# Patient Record
Sex: Male | Born: 1970
Health system: Southern US, Community
[De-identification: ages and names within clinical notes are randomized; demographics above are authoritative.]

## PROBLEM LIST (undated history)

## (undated) DIAGNOSIS — E039 Hypothyroidism, unspecified: Secondary | ICD-10-CM

## (undated) DIAGNOSIS — E785 Hyperlipidemia, unspecified: Secondary | ICD-10-CM

## (undated) DIAGNOSIS — K219 Gastro-esophageal reflux disease without esophagitis: Secondary | ICD-10-CM

## (undated) DIAGNOSIS — T7840XA Allergy, unspecified, initial encounter: Secondary | ICD-10-CM

## (undated) HISTORY — PX: HAND SURGERY: SHX662

## (undated) HISTORY — DX: Gastro-esophageal reflux disease without esophagitis: K21.9

## (undated) HISTORY — DX: Hypothyroidism, unspecified: E03.9

## (undated) HISTORY — DX: Allergy, unspecified, initial encounter: T78.40XA

## (undated) HISTORY — DX: Hyperlipidemia, unspecified: E78.5

---

## 2004-10-11 ENCOUNTER — Ambulatory Visit: Payer: Self-pay | Admitting: Family Medicine

## 2004-10-18 ENCOUNTER — Ambulatory Visit: Payer: Self-pay | Admitting: Family Medicine

## 2005-01-17 ENCOUNTER — Ambulatory Visit: Payer: Self-pay | Admitting: Family Medicine

## 2005-04-18 ENCOUNTER — Ambulatory Visit: Payer: Self-pay | Admitting: Family Medicine

## 2005-05-28 ENCOUNTER — Ambulatory Visit: Payer: Self-pay | Admitting: Family Medicine

## 2005-07-25 ENCOUNTER — Ambulatory Visit: Payer: Self-pay | Admitting: Family Medicine

## 2005-10-31 ENCOUNTER — Ambulatory Visit: Payer: Self-pay | Admitting: Family Medicine

## 2006-07-24 ENCOUNTER — Ambulatory Visit: Payer: Self-pay | Admitting: Family Medicine

## 2006-07-31 ENCOUNTER — Ambulatory Visit: Payer: Self-pay | Admitting: Family Medicine

## 2006-09-04 ENCOUNTER — Ambulatory Visit: Payer: Self-pay | Admitting: Family Medicine

## 2006-11-16 ENCOUNTER — Emergency Department (HOSPITAL_COMMUNITY): Admission: EM | Admit: 2006-11-16 | Discharge: 2006-11-16 | Payer: Self-pay | Admitting: Family Medicine

## 2007-08-04 DIAGNOSIS — J309 Allergic rhinitis, unspecified: Secondary | ICD-10-CM | POA: Insufficient documentation

## 2007-08-06 ENCOUNTER — Ambulatory Visit: Payer: Self-pay | Admitting: Family Medicine

## 2007-08-07 LAB — CONVERTED CEMR LAB
Chloride: 107 meq/L (ref 96–112)
Cholesterol: 204 mg/dL (ref 0–200)
Direct LDL: 157.2 mg/dL
GFR calc non Af Amer: 80 mL/min
Glucose, Bld: 95 mg/dL (ref 70–99)
Sodium: 140 meq/L (ref 135–145)
Total CHOL/HDL Ratio: 5
Triglycerides: 53 mg/dL (ref 0–149)

## 2007-08-09 ENCOUNTER — Ambulatory Visit: Payer: Self-pay | Admitting: Family Medicine

## 2007-09-27 ENCOUNTER — Ambulatory Visit: Payer: Self-pay | Admitting: Family Medicine

## 2008-03-03 ENCOUNTER — Ambulatory Visit: Payer: Self-pay | Admitting: Family Medicine

## 2008-03-16 ENCOUNTER — Ambulatory Visit: Payer: Self-pay | Admitting: Family Medicine

## 2008-03-22 ENCOUNTER — Telehealth (INDEPENDENT_AMBULATORY_CARE_PROVIDER_SITE_OTHER): Payer: Self-pay | Admitting: *Deleted

## 2008-03-24 ENCOUNTER — Ambulatory Visit: Payer: Self-pay | Admitting: Family Medicine

## 2008-08-18 ENCOUNTER — Ambulatory Visit: Payer: Self-pay | Admitting: Family Medicine

## 2008-08-18 LAB — CONVERTED CEMR LAB
ALT: 32 units/L (ref 0–53)
Albumin: 4.2 g/dL (ref 3.5–5.2)
BUN: 16 mg/dL (ref 6–23)
CO2: 30 meq/L (ref 19–32)
Calcium: 9.2 mg/dL (ref 8.4–10.5)
Cholesterol: 236 mg/dL (ref 0–200)
Creatinine, Ser: 1 mg/dL (ref 0.4–1.5)
Creatinine,U: 225.2 mg/dL
Glucose, Bld: 88 mg/dL (ref 70–99)
Microalb, Ur: 0.5 mg/dL (ref 0.0–1.9)
Total Bilirubin: 1.2 mg/dL (ref 0.3–1.2)
Total CHOL/HDL Ratio: 6.1
Total Protein: 7.1 g/dL (ref 6.0–8.3)
Triglycerides: 74 mg/dL (ref 0–149)

## 2008-08-21 ENCOUNTER — Ambulatory Visit: Payer: Self-pay | Admitting: Family Medicine

## 2008-08-21 DIAGNOSIS — E039 Hypothyroidism, unspecified: Secondary | ICD-10-CM | POA: Insufficient documentation

## 2009-08-24 ENCOUNTER — Ambulatory Visit: Payer: Self-pay | Admitting: Family Medicine

## 2009-08-26 LAB — CONVERTED CEMR LAB
ALT: 56 units/L — ABNORMAL HIGH (ref 0–53)
Albumin: 4.3 g/dL (ref 3.5–5.2)
BUN: 12 mg/dL (ref 6–23)
Basophils Relative: 0.2 % (ref 0.0–3.0)
CO2: 28 meq/L (ref 19–32)
Calcium: 9.1 mg/dL (ref 8.4–10.5)
Creatinine, Ser: 1.2 mg/dL (ref 0.4–1.5)
Direct LDL: 185.3 mg/dL
Eosinophils Absolute: 0.2 10*3/uL (ref 0.0–0.7)
Glucose, Bld: 92 mg/dL (ref 70–99)
HCT: 44.3 % (ref 39.0–52.0)
HDL: 38.9 mg/dL — ABNORMAL LOW (ref >39.00)
Lymphs Abs: 2 10*3/uL (ref 0.7–4.0)
MCHC: 34.6 g/dL (ref 30.0–36.0)
MCV: 90.1 fL (ref 78.0–100.0)
Microalb, Ur: 0.7 mg/dL (ref 0.0–1.9)
Monocytes Absolute: 0.5 10*3/uL (ref 0.1–1.0)
Neutrophils Relative %: 51.6 % (ref 43.0–77.0)
Platelets: 213 10*3/uL (ref 150.0–400.0)
Total Protein: 7.2 g/dL (ref 6.0–8.3)
Triglycerides: 132 mg/dL (ref 0.0–149.0)

## 2009-09-03 ENCOUNTER — Ambulatory Visit: Payer: Self-pay | Admitting: Family Medicine

## 2010-02-22 ENCOUNTER — Ambulatory Visit: Payer: Self-pay | Admitting: Family Medicine

## 2010-02-22 DIAGNOSIS — J029 Acute pharyngitis, unspecified: Secondary | ICD-10-CM | POA: Insufficient documentation

## 2010-02-26 ENCOUNTER — Telehealth: Payer: Self-pay | Admitting: Family Medicine

## 2010-02-28 ENCOUNTER — Encounter: Payer: Self-pay | Admitting: Family Medicine

## 2010-03-20 ENCOUNTER — Ambulatory Visit: Payer: Self-pay | Admitting: Family Medicine

## 2010-04-23 ENCOUNTER — Encounter (INDEPENDENT_AMBULATORY_CARE_PROVIDER_SITE_OTHER): Payer: Self-pay | Admitting: *Deleted

## 2010-05-31 ENCOUNTER — Ambulatory Visit: Payer: Self-pay | Admitting: Family Medicine

## 2010-05-31 DIAGNOSIS — M542 Cervicalgia: Secondary | ICD-10-CM | POA: Insufficient documentation

## 2010-05-31 LAB — CONVERTED CEMR LAB
HDL goal, serum: 40 mg/dL
LDL Goal: 130 mg/dL

## 2010-08-28 ENCOUNTER — Telehealth (INDEPENDENT_AMBULATORY_CARE_PROVIDER_SITE_OTHER): Payer: Self-pay | Admitting: *Deleted

## 2010-08-30 ENCOUNTER — Ambulatory Visit: Payer: Self-pay | Admitting: Family Medicine

## 2010-08-30 LAB — CONVERTED CEMR LAB
Albumin: 4.1 g/dL (ref 3.5–5.2)
Alkaline Phosphatase: 68 units/L (ref 39–117)
CO2: 27 meq/L (ref 19–32)
Chloride: 106 meq/L (ref 96–112)
Cholesterol: 239 mg/dL — ABNORMAL HIGH (ref 0–200)
Creatinine, Ser: 0.9 mg/dL (ref 0.4–1.5)
Direct LDL: 178.6 mg/dL
HDL: 40.8 mg/dL (ref >39.00)
Potassium: 4.6 meq/L (ref 3.5–5.1)
Sodium: 141 meq/L (ref 135–145)
TSH: 7.3 microintl units/mL — ABNORMAL HIGH (ref 0.35–5.50)
Total CHOL/HDL Ratio: 6
Total Protein: 6.9 g/dL (ref 6.0–8.3)
Triglycerides: 75 mg/dL (ref 0.0–149.0)
VLDL: 15 mg/dL (ref 0.0–40.0)

## 2010-09-05 ENCOUNTER — Ambulatory Visit: Payer: Self-pay | Admitting: Family Medicine

## 2010-10-15 NOTE — Progress Notes (Signed)
Summary: wants referral to ent  Phone Note Call from Patient Call back at Work Phone [phone]   Caller: Patient Call For: Dr. Dayton Martes Summary of Call: Patient called complaining of his throat. He said it feels like it is really tight and now his ears are also stopped up. He says that is is especially bad after being out in the heat and then going in to the air conditioner. It is really bothering him and he is asking if can go ahead and get referral to ENT. Please advise.  Initial call taken by: Melody Comas,  February 26, 2010 9:09 AM  Follow-up for Phone Call        Appt with Royden Purl on 02/28/2010 at 8:20am. Carlton Adam  February 27, 2010 10:42 AM  Follow-up by: Carlton Adam,  February 27, 2010 10:42 AM
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
## 2010-10-15 NOTE — Consult Note (Signed)
Summary: [facility]  [facility]   Imported By: Lester Gilman 03/04/2010 12:30:13  _____________________________________________________________________  External Attachment:    Type:   Image     Comment:   External Document

## 2010-10-15 NOTE — Assessment & Plan Note (Signed)
Summary: FLU LIKE SYMPTOMS/DLO   Vital Signs:  Patient profile:   40 year old male Height:      69 inches Weight:      191 pounds BMI:     28.31 Temp:     98.6 degrees F oral Pulse rate:   76 / minute Pulse rhythm:   regular BP sitting:   104 / 70  (right arm) Cuff size:   regular  Vitals Entered By: Linde Gillis CMA Duncan Dull) (March 20, 2010 3:30 PM)   Acute Visit History:      The patient complains of cough, earache, headache, nasal discharge, sinus problems, and sore throat.  These symptoms began 4 days ago.  He denies abdominal pain, fever, and vomiting.        The cough interferes with his sleep.  The character of the cough is described as productive, yellow.  There is no history of wheezing or shortness of breath associated with his cough.        Earache symptom: bilateral fullness, no pain.  There is no history of recent antibiotic usage associated with the earache.        He complains of sinus pressure.        Problems Prior to Update: 1)  Sore Throat  (ICD-462) 2)  Hypothyroidism  (ICD-244.9) 3)  Health Maintenance Exam  (ICD-V70.0) 4)  Hypercholesterolemia, 263/ Ldl 211  (ICD-272.0) 5)  Hyperglycemia (110- 02/08)  (ICD-790.29) 6)  Nicotine Addiction/chews  (ICD-305.1) 7)  Allergic Rhinitis  (ICD-477.9)  Current Medications (verified): 1)  Synthroid 75 Mcg Tabs (Levothyroxine Sodium) .... One Tab By Mouth Once Daily 2)  Azithromycin 250 Mg Tabs (Azithromycin) .... 2 Tas By Mouth X 1 Day, Then 1 Tab By Mouth Daily X 4 Days Fill If Not Improving in 3-4 Days, Void After 04/15/2010  Allergies (verified): No Known Drug Allergies  Past History:  Past medical, surgical, family and social histories (including risk factors) reviewed, and no changes noted (except as noted below).  Past Medical History: Reviewed history from 08/04/2007 and no changes required. Allergic rhinitis GERD  Family History: Reviewed history from 08/21/2008 and no changes required. Adopted and has  no known relative histories Father: Alive Mother:  His real mother died in her 72's of alcohol disease Siblings:Adopted  3 brothers, older had HTN, 3 older sisters 1 sister on thyroid meds CV - none HBP + oldest brother  Social History: Reviewed history from 09/27/2007 and no changes required. Marital Status: Married Children: None Occupation:Is in heating and air  dips tobacco NON Smoker  Review of Systems General:  Complains of fatigue; denies fever. CV:  Complains of chest pain or discomfort; CP with cough. GI:  Denies abdominal pain, diarrhea, nausea, and vomiting.  Physical Exam  General:  Well-developed,well-nourished,in no acute distress; alert,appropriate and cooperative throughout examination Head:  no maxillary sinus ttp Eyes:  No corneal or conjunctival inflammation noted. EOMI. Perrla. Funduscopic exam benign, without hemorrhages, exudates or papilledema. Vision grossly normal. Ears:  clear fluid B TMs.  Nose:  External nasal examination shows no deformity or inflammation. Nasal mucosa are pink and moist without lesions or exudates. Mouth:  Oral mucosa and oropharynx without lesions or exudates.  Teeth in good repair. Neck:  no carotid bruit or thyromegaly no cervical or supraclavicular lymphadenopathy  Lungs:  Normal respiratory effort, chest expands symmetrically. Lungs are clear to auscultation, no crackles or wheezes. Heart:  Normal rate and regular rhythm. S1 and S2 normal without gallop, murmur, click,  rub or other extra sounds.   Impression & Recommendations:  Problem # 1:  URI (ICD-465.9) Symptomatic care...mucinex two times a day. If not improving as expected treat with antibiotic given pt concern of progression.   Complete Medication List: 1)  Synthroid 75 Mcg Tabs (Levothyroxine sodium) .... One tab by mouth once daily 2)  Azithromycin 250 Mg Tabs (Azithromycin) .... 2 tas by mouth x 1 day, then 1 tab by mouth daily x 4 days fill if not improving in 3-4  days, void after 04/15/2010  Patient Instructions: 1)  Use mucinex (guafenesin) two times a day. 2)   If not improving in next 2-3 days...fill antibiotics. 3)  Call if not improving after 7-10 days. Prescriptions: AZITHROMYCIN 250 MG TABS (AZITHROMYCIN) 2 tas by mouth x 1 day, then 1 tab by mouth daily x 4 days FIll if not improving in 3-4 days, VOID after 04/15/2010  #6 x 0   Entered and Authorized by:   Kerby Nora MD   Signed by:   Kerby Nora MD on 03/20/2010   Method used:   Print then Give to Patient   RxID:   [id number]   Current Allergies (reviewed today): No known allergies

## 2010-10-15 NOTE — Letter (Signed)
Summary: Nadara Eaton letter  [place] [facility]  [address] [place], [place] [address]   Phone: [phone]  Fax: [phone]       04/23/2010 MRN: [id number]  Sinan Sampey [address] [place], [place]  [address]  Dear Mr. Mariel [facility] [place], and [facility] announce the retirement of Arta Silence, M.D., from full-time practice at the [facility] office effective March 14, 2010 and his plans of returning part-time.  It is important to Dr. Hetty Ely and to our practice that you understand that [facility] has seven physicians in our office for your health care needs.  We will continue to offer the same exceptional care that you have today.    Dr. Hetty Ely has spoken to many of you about his plans for retirement and returning part-time in the fall.   We will continue to work with you through the transition to schedule appointments for you in the office and meet the high standards that [facility] is committed to.   Again, it is with great pleasure that we share the news that Dr. Hetty Ely will return to [facility] at [facility] in October of 2011 with a reduced schedule.    If you have any questions, or would like to request an appointment with one of our physicians, please call us at [phone] and press the option for Scheduling an appointment.  We take pleasure in providing you with excellent patient care and look forward to seeing you at your next office visit.  Our [facility] Physicians are:  Tillman Abide, M.D. Laurita Quint, M.D. Roxy Manns, M.D. Kerby Nora, M.D. Hannah Beat, M.D. Ruthe Mannan, M.D. We proudly welcomed Raechel Ache, M.D. and Eustaquio Boyden, M.D. to the practice in July/August 2011.  Sincerely,  [facility]

## 2010-10-15 NOTE — Assessment & Plan Note (Signed)
Summary: SORE THROAT/RBH   Vital Signs:  Patient profile:   40 year old male Height:      69 inches Weight:      192.25 pounds BMI:     28.49 Temp:     98.6 degrees F oral Pulse rate:   68 / minute Pulse rhythm:   regular BP sitting:   140 / 90  (left arm) Cuff size:   regular  Vitals Entered By: Linde Gillis CMA Duncan Dull) (February 22, 2010 10:20 AM) CC: sore throat   History of Present Illness: 40 yo with 1 week of sore throat.  Actually painful externally, not inside of throat. Tender to touch on outside. No difficulty or pain swallowing. No runny nose, cough, shortness of breath, fever or other symptoms.  Has been working in dry wall and with paint fumes. Sometimes forgets to wear his mask.  Does not smoke cigarettes but he does chew tobacco.  Finished with this new job that exposes him to alot of dusk and fumes.  Current Medications (verified): 1)  Synthroid 75 Mcg Tabs (Levothyroxine Sodium) .... One Tab By Mouth Once Daily  Allergies (verified): No Known Drug Allergies  Review of Systems      See HPI General:  Denies chills and fever. ENT:  Denies difficulty swallowing and earache. Resp:  Denies cough, shortness of breath, sputum productive, and wheezing. MS:  Denies joint pain, joint redness, joint swelling, and loss of strength.  Physical Exam  General:  Well-developed,well-nourished,in no acute distress; alert,appropriate and cooperative throughout examination Ears:  External ear exam shows no significant lesions or deformities.  Otoscopic examination reveals clear canals, tympanic membranes are intact bilaterally without bulging, retraction, inflammation or discharge. Hearing is grossly normal bilaterally. Nose:  External nasal examination shows no deformity or inflammation. Nasal mucosa are pink and moist without lesions or exudates. Mouth:  Oral mucosa and oropharynx without lesions or exudates.  Teeth in good repair. Neck:  supple, full ROM, and no masses.    Non tender to palp Lungs:  Normal respiratory effort, chest expands symmetrically. Lungs are clear to auscultation, no crackles or wheezes. Heart:  Normal rate and regular rhythm. S1 and S2 normal without gallop, murmur, click, rub or other extra sounds. Skin:  Intact without suspicious lesions or rashes Cervical Nodes:  No lymphadenopathy noted Psych:  Cognition and judgment appear intact. Alert and cooperative with normal attention span and concentration. No apparent delusions, illusions, hallucinations   Impression & Recommendations:  Problem # 1:  SORE THROAT (ICD-462) Assessment New Reassurance provided.  Perhaps had some reactive lymph nodes from exposure to chemicals and dust.  Since he does chew tobacco, recommended close follow up.  If symptoms do not continue to improve, likely needs to be evaluated by ENT.  Complete Medication List: 1)  Synthroid 75 Mcg Tabs (Levothyroxine sodium) .... One tab by mouth once daily  Patient Instructions: 1)  If no improvement, please call us if no improvement in 1-2 weeks. 2)  STOP DIPPING.  Current Allergies (reviewed today): No known allergies

## 2010-10-17 NOTE — Assessment & Plan Note (Signed)
Summary: cpx/rbh dr schaller pt  R/S FROM 12/23   Vital Signs:  Patient profile:   40 year old male Height:      69 inches Weight:      199.25 pounds BMI:     29.53 Temp:     98.2 degrees F oral Pulse rate:   80 / minute Pulse rhythm:   regular BP sitting:   110 / 68  (left arm) Cuff size:   large  Vitals Entered By: Delilah Shan CMA Duncan Dull) (September 05, 2010 8:59 AM) CC: CPX - RNS patient   History of Present Illness: CPE- See plan.  Neck discomfort is better.  See last OV note.  It sometimes happens when he tenses up at work and he notices the pain, but then it self resolves.  If he gets nervous, it happens.  Anterior near the hyoid bone.  No dysphagia.  No FCNAVDR, no other complaints.    Allergies: No Known Drug Allergies  Past History:  Family History: Last updated: 09/05/2010 Adopted (his biological mother gave him up for adoption and he was adopted by her sister) Father: unknown Mother:  His biological mother died in her 13's of alcohol disease Siblings:Adopted  3 brothers, older had HTN, 3 older sisters 1 sister on thyroid meds CV - none HBP + oldest brother  Social History: Last updated: 09/05/2010 Marital Status: Married, 2002 Children: None Occupation:Is in heating and air  dips tobacco, working on quitting 2012 Nonsmoker  Past Medical History: Allergic rhinitis GERD Hypothyroidism  Past Surgical History: Denies surgical history  Family History: Adopted (his biological mother gave him up for adoption and he was adopted by her sister) Father: unknown Mother:  His biological mother died in her 56's of alcohol disease Siblings:Adopted  3 brothers, older had HTN, 3 older sisters 1 sister on thyroid meds CV - none HBP + oldest brother  Social History: Marital Status: Married, 2002 Children: None Occupation:Is in heating and air  dips tobacco, working on quitting 2012 Nonsmoker  Review of Systems       See HPI.  Otherwise negative.     Physical Exam  General:  GEN: nad, alert and oriented HEENT: mucous membranes moist NECK: supple w/o LA, not tender to palpation, no nodularity or asymmetry of thyroid appreciated CV: rrr.  no murmur PULM: ctab, no inc wob ABD: soft, +bs EXT: no edema SKIN: no acute rash    Impression & Recommendations:  Problem # 1:  HEALTH MAINTENANCE EXAM (ICD-V70.0) colon/prostate ca screen not indicated.  flu shot encouarged.  D/w patient [id number] and diet.  recheck lipids in 1 year.  Td up to date.    Problem # 2:  HYPOTHYROIDISM (ICD-244.9) I think he is tensing up and having muscle strain on the anterior/strap muscles.  I don't appreciated other abnormality.  It is episodic and we can investigate further if symptoms worsen.  See instructions re:TSH and synthroid.   His updated medication list for this problem includes:    Synthroid 75 Mcg Tabs (Levothyroxine sodium) .Marland Kitchen... 1.5 tabs on thursday and 1 tab by mouth other days of the week  Orders: Prescription Created Electronically [id number])  Complete Medication List: 1)  Synthroid 75 Mcg Tabs (Levothyroxine sodium) .... 1.5 tabs on thursday and 1 tab by mouth other days of the week  Patient Instructions: 1)  Increase your thyroid medicine- 1.5 tabs on Thursday and 1 tab on other days.  Recheck TSH in 2 months.  244.9 2)  Work on exercising  more and cutting back on fatty foods.  I would recheck your cholesterol next year at a physical.   3)  Think about getting a flu shot.   4)  Take care.  Glad to see you.   Prescriptions: SYNTHROID 75 MCG TABS (LEVOTHYROXINE SODIUM) 1.5 tabs on Thursday and 1 tab by mouth other days of the week  #100 x 3   Entered and Authorized by:   Crawford Givens MD   Signed by:   Crawford Givens MD on 09/05/2010   Method used:   Electronically to        [facility] [address] (retail)       [address] [place], [place]  [address]       Ph: [phone]       Fax:  [phone]   RxID:   [id number]    Orders Added: 1)  Est. Patient 40-64 years [99396] 2)  Est. Patient Level III [40347] 3)  Prescription Created Electronically [phone]

## 2010-10-17 NOTE — Progress Notes (Signed)
----   Converted from flag ---- ---- 08/27/2010 1:46 PM, Crawford Givens MD wrote: tsh 244.9 cmet/lipid 272.0  ---- 08/27/2010 9:55 AM, Liane Comber CMA (AAMA) wrote: Lab orders please! Good Morning! This pt is scheduled for cpx labs Friday, which labs to draw and dx codes to use? Thanks Tasha ------------------------------

## 2010-11-08 ENCOUNTER — Encounter (INDEPENDENT_AMBULATORY_CARE_PROVIDER_SITE_OTHER): Payer: Self-pay | Admitting: *Deleted

## 2010-11-08 ENCOUNTER — Other Ambulatory Visit (INDEPENDENT_AMBULATORY_CARE_PROVIDER_SITE_OTHER): Payer: 59

## 2010-11-08 ENCOUNTER — Other Ambulatory Visit: Payer: Self-pay | Admitting: Family Medicine

## 2010-11-08 DIAGNOSIS — E039 Hypothyroidism, unspecified: Secondary | ICD-10-CM

## 2010-12-23 ENCOUNTER — Encounter: Payer: Self-pay | Admitting: Family Medicine

## 2010-12-25 ENCOUNTER — Encounter: Payer: Self-pay | Admitting: Family Medicine

## 2010-12-25 ENCOUNTER — Ambulatory Visit (INDEPENDENT_AMBULATORY_CARE_PROVIDER_SITE_OTHER): Payer: BC Managed Care – PPO | Admitting: Family Medicine

## 2010-12-25 VITALS — BP 126/96 | HR 76 | Temp 98.1°F | Wt 200.1 lb

## 2010-12-25 DIAGNOSIS — J029 Acute pharyngitis, unspecified: Secondary | ICD-10-CM

## 2010-12-25 DIAGNOSIS — J309 Allergic rhinitis, unspecified: Secondary | ICD-10-CM

## 2010-12-25 NOTE — Assessment & Plan Note (Addendum)
Start claritin/loratadine 10mg  a day and use nasal saline.  Can gargle with warm salt water for OP irritation. Fu prn.  He agrees. I doubt this is infectious.  He may have pulled strap muscle with sneezing.

## 2010-12-25 NOTE — Patient Instructions (Signed)
Start claritin/loratadine 10mg  a day and use nasal saline.  Can gargle with warm salt water for irritation in yourmouth.  Take care.  Let me know if you aren't getting better. Marland Kitchen

## 2010-12-25 NOTE — Progress Notes (Signed)
Sx going on for about 2 weeks.  ST is better now.  Sinus pain.  Some nosebleeds.  He was having some dry mouth from mouth breathing at night.  Sig pollen exposure with work outside. B ear pain.  Still with tongue irritation. Was prev on allegra years ago, with some relief.  Didn't tolerate flonase.  No fevers, but did have one night with sweats prev.   Occ pain under the chin, noted inc with sneezing and with lifting.  This had happened prev and was thought to be due to a strap muscle irritation- and it had prev resolved.    Meds, vitals, and allergies reviewed.   ROS: See HPI.  Otherwise, noncontributory.  GEN: nad, alert and oriented HEENT: mucous membranes moist, tm w/o erythema but B SOM noted, nasal exam with mild erythema, clear discharge noted,  OP with cobblestoning NECK: supple w/o LA CV: rrr.   PULM: ctab, no inc wob EXT: no edema SKIN: no acute rash

## 2011-02-03 ENCOUNTER — Encounter: Payer: Self-pay | Admitting: Family Medicine

## 2011-02-03 ENCOUNTER — Ambulatory Visit (INDEPENDENT_AMBULATORY_CARE_PROVIDER_SITE_OTHER): Payer: BC Managed Care – PPO | Admitting: Family Medicine

## 2011-02-03 DIAGNOSIS — K117 Disturbances of salivary secretion: Secondary | ICD-10-CM

## 2011-02-03 DIAGNOSIS — R682 Dry mouth, unspecified: Secondary | ICD-10-CM

## 2011-02-03 NOTE — Patient Instructions (Signed)
Use the nicotine gum to get off the dip, use mints to keep your mouth from getting dry, and call me if you aren't getting better in about 1-2 weeks.

## 2011-02-03 NOTE — Assessment & Plan Note (Addendum)
I doubt a process like sjogren's syndrome, no h/o dry eyes.  This may be due to allergic sx- ie allergic rhinitis and subsequent mouth breathing- with a component from the dip. I encouraged him to get off tobacco, use mints, and let me know if not improved.  I don't see acute changes that are worrisome.  If he continues to have sx, he'll call me.  He agrees.

## 2011-02-03 NOTE — Progress Notes (Signed)
Dry mouth and tongue symptoms.  It seemed to start after 1 night where his tongue dried out after sleeping with mouth open.  He occ has intermittent tongue discomfort.  If having pain, then he can drink milk and it will help.  Notes symptoms on roof of mouth also.  Dipping <1 can a day of snuff, trying to quit.  Sig pollen exposure with grass cutting, taking tylenol/pseudophed for this with some relief.  Allergy sx not better with loratadine.    Meds, vitals, and allergies reviewed.   ROS: See HPI.  Otherwise, noncontributory.  nad ncat Tm wnl Nasal exam wnl Op exam w/o ulceration or acute abnormality noted on the tongue, gingiva, buccal membranes or in the posterior op.  No thrush noted. MMM Neck supple w/o LA.

## 2011-02-09 ENCOUNTER — Inpatient Hospital Stay (INDEPENDENT_AMBULATORY_CARE_PROVIDER_SITE_OTHER)
Admission: RE | Admit: 2011-02-09 | Discharge: 2011-02-09 | Disposition: A | Discharge status: Home / Self Care | Payer: BC Managed Care – PPO | Source: Ambulatory Visit | Attending: Emergency Medicine | Admitting: Emergency Medicine

## 2011-02-09 DIAGNOSIS — J069 Acute upper respiratory infection, unspecified: Secondary | ICD-10-CM

## 2011-02-09 DIAGNOSIS — H9209 Otalgia, unspecified ear: Secondary | ICD-10-CM

## 2011-02-09 DIAGNOSIS — J029 Acute pharyngitis, unspecified: Secondary | ICD-10-CM

## 2011-02-28 ENCOUNTER — Telehealth: Payer: Self-pay | Admitting: *Deleted

## 2011-02-28 DIAGNOSIS — R682 Dry mouth, unspecified: Secondary | ICD-10-CM

## 2011-02-28 NOTE — Telephone Encounter (Signed)
Patient wanted to let you know that his tongue and roof of mouth is still numb. He is asking what to do at this point because he say that he has been dealing with this for a while now. Please advise.

## 2011-02-28 NOTE — Telephone Encounter (Signed)
I LMOVM for patient.  If he is still having trouble, then I'd have him see ENT.  I went ahead and put in the referral, but I'll ask Shirlee Limerick to hold on this for now.  Please call the patient and then notify [facility].  Thanks.

## 2011-03-03 NOTE — Telephone Encounter (Signed)
Appt made with Royden Purl on 03/26/2011 pt has been notified.

## 2011-03-03 NOTE — Telephone Encounter (Signed)
Advised pt, he agrees to ENT referral.  Has been to [facility] before, doesn't remember who he saw.

## 2011-07-24 ENCOUNTER — Other Ambulatory Visit: Payer: Self-pay | Admitting: *Deleted

## 2011-07-24 MED ORDER — LEVOTHYROXINE SODIUM 75 MCG PO TABS: ORAL_TABLET | ORAL | Status: DC

## 2011-07-24 NOTE — Telephone Encounter (Signed)
Received faxed refill request from pharmacy. Rx sent to pharmacy electronically. 

## 2011-08-20 ENCOUNTER — Other Ambulatory Visit: Payer: Self-pay | Admitting: Family Medicine

## 2012-01-15 ENCOUNTER — Other Ambulatory Visit: Payer: Self-pay | Admitting: *Deleted

## 2012-01-15 MED ORDER — LEVOTHYROXINE SODIUM 75 MCG PO TABS: ORAL_TABLET | ORAL | Status: DC

## 2012-02-13 ENCOUNTER — Other Ambulatory Visit: Payer: Self-pay | Admitting: Family Medicine

## 2012-02-13 NOTE — Telephone Encounter (Signed)
Electronic refill request.  Has not had appt in some time.  Please advise.

## 2012-02-14 NOTE — Telephone Encounter (Signed)
Sent, please schedule CPE with lab visit ahead of time.  Thanks.

## 2012-02-16 NOTE — Telephone Encounter (Signed)
Patient advised.  Says he will call back in for CPE.

## 2012-03-19 ENCOUNTER — Other Ambulatory Visit: Payer: Self-pay | Admitting: Family Medicine

## 2012-03-19 ENCOUNTER — Other Ambulatory Visit: Payer: Self-pay

## 2012-03-19 NOTE — Telephone Encounter (Signed)
Pt requesting refill on Synthroid; pt should have one more refill and will ck with H/T. Pt will call for appt.

## 2012-04-14 ENCOUNTER — Other Ambulatory Visit: Payer: Self-pay | Admitting: Family Medicine

## 2012-04-14 NOTE — Telephone Encounter (Signed)
Patient hasn't been seen in a year. Rx was last filled 02/13/12? Ok to refill?

## 2012-04-14 NOTE — Telephone Encounter (Signed)
Per chart, he was advised to schedule CPE with the last refill.  Deny this unless he is scheduled for CPE.  If scheduled, okay to fill through that date.

## 2012-04-15 NOTE — Telephone Encounter (Signed)
Patient says he is "swamped" at work right now and just found out he may have "jury duty" next week.  He is asking if he could get the labs drawn now and schedule a CPE in the Fall?

## 2012-04-15 NOTE — Telephone Encounter (Signed)
Sent.  Schedule CPE for the fall.  

## 2012-08-06 ENCOUNTER — Other Ambulatory Visit: Payer: Self-pay | Admitting: Family Medicine

## 2012-08-06 NOTE — Telephone Encounter (Signed)
Electronic refill request..  Patient has not been seen here since May 2012.  Please advise.

## 2012-08-06 NOTE — Telephone Encounter (Signed)
Wife advised. 

## 2012-08-06 NOTE — Telephone Encounter (Signed)
Sent, needs a CPE with labs ahead of time.  Thanks.

## 2012-09-01 ENCOUNTER — Other Ambulatory Visit: Payer: Self-pay | Admitting: Family Medicine

## 2012-11-01 ENCOUNTER — Ambulatory Visit
Admission: RE | Admit: 2012-11-01 | Discharge: 2012-11-01 | Disposition: A | Discharge status: Home / Self Care | Payer: 59 | Source: Ambulatory Visit | Attending: Family Medicine | Admitting: Family Medicine

## 2012-11-01 ENCOUNTER — Encounter: Payer: Self-pay | Admitting: Family Medicine

## 2012-11-01 ENCOUNTER — Other Ambulatory Visit: Payer: Self-pay | Admitting: Family Medicine

## 2012-11-01 ENCOUNTER — Ambulatory Visit (INDEPENDENT_AMBULATORY_CARE_PROVIDER_SITE_OTHER): Payer: 59 | Admitting: Family Medicine

## 2012-11-01 VITALS — BP 104/80 | HR 68 | Temp 98.4°F | Wt 201.0 lb

## 2012-11-01 DIAGNOSIS — N50819 Testicular pain, unspecified: Secondary | ICD-10-CM

## 2012-11-01 DIAGNOSIS — N509 Disorder of male genital organs, unspecified: Secondary | ICD-10-CM

## 2012-11-01 MED ORDER — LEVOTHYROXINE SODIUM 75 MCG PO TABS: ORAL_TABLET | ORAL | Status: DC

## 2012-11-01 NOTE — Patient Instructions (Addendum)
Schedule a physical for spring or summer.   See Shirlee Limerick about your referral before you leave today. We'll contact you with your report. If you have any other symptoms, then notify the clinic.

## 2012-11-01 NOTE — Progress Notes (Signed)
He had some pain in groin after long distance travel in 12/13.  The pain resolved. No other symptoms at that point.  No discharge.    Doing well with no trouble/sx with intercourse 2/14 but then saw a small amount of blood in shorts after intercourse.   "My mind has been on it and I don't know if that make the pain come back."   Pain/discomfort is in the R testicle.    Monogamous.  No h/o STDs.  No h/o testing.  Last urinated about 9:30.   His prev dry mouth is now intermittent and improved from prev.  He has seen ENT prev.   Meds, vitals, and allergies reviewed.   ROS: See HPI.  Otherwise, noncontributory.  nad Testes bilaterally descended without nodularity, tenderness or masses. No scrotal masses or lesions. No penis lesions or urethral discharge.

## 2012-11-02 NOTE — Assessment & Plan Note (Signed)
Unremarkable exam and u/s.  Would follow for now with f/u testing of urine if sx continue.  D/w pt about the plan (assuming neg u/s) before the pt went for u/s.  He agreed.

## 2012-11-06 ENCOUNTER — Other Ambulatory Visit: Payer: Self-pay | Admitting: Family Medicine

## 2012-11-24 ENCOUNTER — Ambulatory Visit (INDEPENDENT_AMBULATORY_CARE_PROVIDER_SITE_OTHER): Payer: 59 | Admitting: Family Medicine

## 2012-11-24 ENCOUNTER — Encounter: Payer: Self-pay | Admitting: Family Medicine

## 2012-11-24 ENCOUNTER — Telehealth: Payer: Self-pay

## 2012-11-24 VITALS — BP 110/84 | HR 68 | Temp 98.1°F | Wt 198.5 lb

## 2012-11-24 DIAGNOSIS — R361 Hematospermia: Secondary | ICD-10-CM

## 2012-11-24 DIAGNOSIS — N50819 Testicular pain, unspecified: Secondary | ICD-10-CM

## 2012-11-24 DIAGNOSIS — R1030 Lower abdominal pain, unspecified: Secondary | ICD-10-CM

## 2012-11-24 DIAGNOSIS — N509 Disorder of male genital organs, unspecified: Secondary | ICD-10-CM

## 2012-11-24 DIAGNOSIS — R109 Unspecified abdominal pain: Secondary | ICD-10-CM

## 2012-11-24 LAB — POCT URINALYSIS DIPSTICK
Blood, UA: NEGATIVE
Ketones, UA: NEGATIVE
Leukocytes, UA: NEGATIVE
Spec Grav, UA: 1.02
pH, UA: 5

## 2012-11-24 NOTE — Telephone Encounter (Signed)
Pt seen 11/01/12; this weekend lifting brush outside and began to notice rt groin and testicle pain again (pain level 2-3 when lifting). No lumps or knots noticed and no penial discharge; reddish semen again as discussed 02/17 visit. [employer].Please advise.
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
## 2012-11-24 NOTE — Telephone Encounter (Signed)
Called pt. Appt scheduled for today.

## 2012-11-24 NOTE — Patient Instructions (Addendum)
Go to the lab on the way out.  We'll contact you with your lab report. If all your labs are normal, then we'll likely set you up with urology.  Take care.

## 2012-11-24 NOTE — Telephone Encounter (Signed)
See about getting him on the schedule tomorrow with me.  We need to check some urine labs and I'd like to check him at the same time.  Make sure he has gone at least 1 hour without urinating before he comes in.  Thanks.

## 2012-11-24 NOTE — Assessment & Plan Note (Signed)
With likely/presumed hematospermia x2.  No symptoms now and normal exam today.  Prev with u/s wnl.  ucx and GC/chlam pending.  If both neg, then likely refer to uro. He agrees. Will hold off on abx now as likelihood of STI is very low and prostate isn't ttp.

## 2012-11-26 LAB — URINE CULTURE
Colony Count: NO GROWTH
Organism ID, Bacteria: NO GROWTH

## 2012-11-26 NOTE — Addendum Note (Signed)
Addended by: Joaquim Nam on: 11/26/2012 08:07 AM   Modules accepted: Orders

## 2013-04-09 ENCOUNTER — Other Ambulatory Visit: Payer: Self-pay | Admitting: Family Medicine

## 2013-04-11 NOTE — Telephone Encounter (Signed)
Electronic refill request. Please advise. Last refill stated that he needed an appt.  No appt scheduled.  TSH not checked since 2012.

## 2013-04-11 NOTE — Telephone Encounter (Signed)
If he'll schedule the CPE, then call send it in.  Thanks.

## 2013-04-12 ENCOUNTER — Other Ambulatory Visit: Payer: Self-pay | Admitting: *Deleted

## 2013-04-12 MED ORDER — SYNTHROID 75 MCG PO TABS: ORAL_TABLET | ORAL | Status: DC

## 2013-04-12 NOTE — Telephone Encounter (Signed)
Called patient.  He says he will try to schedule in September when he finishes this project at work.  He requested a 1 month supply to last until he can schedule a CPE.  Rx sent to pharmacy.

## 2013-05-09 ENCOUNTER — Other Ambulatory Visit: Payer: Self-pay | Admitting: Family Medicine

## 2013-05-09 DIAGNOSIS — E039 Hypothyroidism, unspecified: Secondary | ICD-10-CM

## 2013-05-09 DIAGNOSIS — E7800 Pure hypercholesterolemia, unspecified: Secondary | ICD-10-CM

## 2013-05-10 ENCOUNTER — Other Ambulatory Visit (INDEPENDENT_AMBULATORY_CARE_PROVIDER_SITE_OTHER): Payer: 59

## 2013-05-10 DIAGNOSIS — E039 Hypothyroidism, unspecified: Secondary | ICD-10-CM

## 2013-05-10 DIAGNOSIS — E7800 Pure hypercholesterolemia, unspecified: Secondary | ICD-10-CM

## 2013-05-10 LAB — TSH: TSH: 10.07 u[IU]/mL — ABNORMAL HIGH (ref 0.35–5.50)

## 2013-05-10 LAB — LIPID PANEL
Cholesterol: 265 mg/dL — ABNORMAL HIGH (ref 0–200)
HDL: 39.8 mg/dL (ref >39.00)
Total CHOL/HDL Ratio: 7
Triglycerides: 129 mg/dL (ref 0.0–149.0)

## 2013-05-10 LAB — BASIC METABOLIC PANEL
CO2: 27 mEq/L (ref 19–32)
Chloride: 106 mEq/L (ref 96–112)
Creatinine, Ser: 1.1 mg/dL (ref 0.4–1.5)
Sodium: 137 mEq/L (ref 135–145)

## 2013-05-13 ENCOUNTER — Ambulatory Visit (INDEPENDENT_AMBULATORY_CARE_PROVIDER_SITE_OTHER): Payer: 59 | Admitting: Family Medicine

## 2013-05-13 ENCOUNTER — Encounter: Payer: Self-pay | Admitting: Family Medicine

## 2013-05-13 VITALS — BP 110/80 | HR 56 | Temp 98.0°F | Ht 69.0 in | Wt 198.2 lb

## 2013-05-13 DIAGNOSIS — Z0000 Encounter for general adult medical examination without abnormal findings: Secondary | ICD-10-CM | POA: Insufficient documentation

## 2013-05-13 DIAGNOSIS — E785 Hyperlipidemia, unspecified: Secondary | ICD-10-CM | POA: Insufficient documentation

## 2013-05-13 DIAGNOSIS — R682 Dry mouth, unspecified: Secondary | ICD-10-CM

## 2013-05-13 DIAGNOSIS — E039 Hypothyroidism, unspecified: Secondary | ICD-10-CM

## 2013-05-13 MED ORDER — LEVOTHYROXINE SODIUM 100 MCG PO TABS: ORAL_TABLET | ORAL | Status: DC

## 2013-05-13 NOTE — Patient Instructions (Addendum)
I would get a flu shot each fall.   Note thyroid dose change.  Recheck TSH in about 2 months. Take care. Cut out the fast food. Recheck lipids next year.

## 2013-05-13 NOTE — Progress Notes (Signed)
CPE- See plan.  Routine anticipatory guidance given to patient.  See health maintenance. Tetanus 2005 Flu shot encouraged.  Diet and exercise discussed.  Living will d/w pt.  Encouraged.  Wife designated if he is incapacitated.  Labs d/w pt.    Elevated Cholesterol: No meds Diet compliance: he's working on diet.  "I need my wife to get less of the stuff around."  More fast food recently.  Exercise: at work.  Diet is the main issue.    GERD, diet exacerbated. Controlled with meds.   Hypothyroid.  TSH up, discussed.  No dysphagia.  No neck mass.   PMH and SH reviewed  Meds, vitals, and allergies reviewed.   ROS: See HPI.  Otherwise negative.    GEN: nad, alert and oriented HEENT: mucous membranes moist NECK: supple w/o LA, thyroid not ttp CV: rrr. PULM: ctab, no inc wob ABD: soft, +bs EXT: no edema SKIN: no acute rash

## 2013-05-13 NOTE — Assessment & Plan Note (Signed)
Inc replacement and recheck TSH in ~2 months.  He agrees. D/w pt.

## 2013-05-13 NOTE — Assessment & Plan Note (Signed)
Discussed, diet is the main issue here.  He'll work on this.  We can recheck periodically.

## 2013-05-13 NOTE — Assessment & Plan Note (Signed)
Routine anticipatory guidance given to patient.  See health maintenance. Tetanus 2005 Flu shot encouraged.  Diet and exercise discussed.  Living will d/w pt.  Encouraged.  Wife designated if he is incapacitated.  Labs d/w pt.

## 2013-05-13 NOTE — Assessment & Plan Note (Signed)
Improved with GERD tx.

## 2013-11-25 ENCOUNTER — Other Ambulatory Visit (INDEPENDENT_AMBULATORY_CARE_PROVIDER_SITE_OTHER): Payer: 59

## 2013-11-25 DIAGNOSIS — E039 Hypothyroidism, unspecified: Secondary | ICD-10-CM

## 2013-11-25 LAB — TSH: TSH: 1.42 u[IU]/mL (ref 0.35–5.50)

## 2014-03-26 ENCOUNTER — Other Ambulatory Visit: Payer: Self-pay | Admitting: Family Medicine

## 2014-04-25 ENCOUNTER — Other Ambulatory Visit: Payer: Self-pay | Admitting: Family Medicine

## 2014-08-04 ENCOUNTER — Other Ambulatory Visit: Payer: Self-pay | Admitting: Family Medicine

## 2014-09-25 ENCOUNTER — Telehealth: Payer: Self-pay | Admitting: Family Medicine

## 2014-09-25 NOTE — Telephone Encounter (Signed)
Pt calls in with a medical question, after sex he bleeds a little bit. He said he had this problem before and that he was sent to a Urologist but the couldn't find anything. He has a few questions regarding this. He would like to speak with Dr Para Marchuncan since this is a sensitive subject

## 2014-09-25 NOTE — Telephone Encounter (Signed)
Talked to patient.  Prev with neg w/u for the same prev.  D/w pt.  No sx other than after sex, not systemically ill, no dysuria.  We can refer to uro now or have him monitor this at home and notify me if recurrent.  He opts for the latter, reasonable.  He is going to schedule a CPE in the near future, will call back for that.  He thanked me for the call.

## 2014-11-09 ENCOUNTER — Other Ambulatory Visit: Payer: Self-pay | Admitting: Family Medicine

## 2014-11-09 NOTE — Telephone Encounter (Signed)
If he'll schedule the CPE/labs, then okay to send in. I denied it in the meantime.

## 2014-11-09 NOTE — Telephone Encounter (Signed)
Electronic refill request. Last OV:  05/13/13 for CPE.  Patient was warned at last refill request that an appt for CPE was required prior to further refills.  No appt scheduled. Please advise. Last Filled:     30 tablet 2 Rf on 08/07/2014

## 2014-11-09 NOTE — Telephone Encounter (Signed)
Left message on patient's voicemail to return call

## 2014-11-10 NOTE — Telephone Encounter (Signed)
Left detailed message on voicemail to schedule CPE/lab appt and to let us know when that has been done and we will fill the Rx until the appt.

## 2014-12-15 ENCOUNTER — Other Ambulatory Visit: Payer: Self-pay | Admitting: Family Medicine

## 2014-12-15 DIAGNOSIS — E038 Other specified hypothyroidism: Secondary | ICD-10-CM

## 2014-12-15 DIAGNOSIS — E785 Hyperlipidemia, unspecified: Secondary | ICD-10-CM

## 2014-12-15 NOTE — Telephone Encounter (Signed)
Electronic refill request.  Last office visit:   05/13/13.  Note was sent with last RF for patient to schedule CPE.  Last Filled:    30 tablet 2 RF on 08/07/2014  No appt scheduled.  Please advise.

## 2014-12-17 NOTE — Telephone Encounter (Signed)
Sent it when he schedules a physical.  Thanks.

## 2014-12-18 NOTE — Telephone Encounter (Signed)
Sent, lab orders are in.  Thanks.

## 2014-12-18 NOTE — Telephone Encounter (Signed)
Patient advised.   He will call in for lab appt sometime in the next 2 weeks.

## 2014-12-18 NOTE — Telephone Encounter (Signed)
Patient left a voicemail stating that he has been working 6-7 days a week and only has 2 pills left. Patient stated that he would like to come in and get lab work done now and get a physical scheduled later when things slow down. Please advise.

## 2015-02-17 ENCOUNTER — Other Ambulatory Visit: Payer: Self-pay | Admitting: Family Medicine

## 2015-03-02 ENCOUNTER — Other Ambulatory Visit (INDEPENDENT_AMBULATORY_CARE_PROVIDER_SITE_OTHER): Payer: 59

## 2015-03-02 DIAGNOSIS — E785 Hyperlipidemia, unspecified: Secondary | ICD-10-CM

## 2015-03-02 DIAGNOSIS — E038 Other specified hypothyroidism: Secondary | ICD-10-CM

## 2015-03-02 LAB — COMPREHENSIVE METABOLIC PANEL
ALK PHOS: 64 U/L (ref 39–117)
ALT: 30 U/L (ref 0–53)
AST: 24 U/L (ref 0–37)
Albumin: 4.4 g/dL (ref 3.5–5.2)
BILIRUBIN TOTAL: 0.7 mg/dL (ref 0.2–1.2)
BUN: 21 mg/dL (ref 6–23)
CHLORIDE: 106 meq/L (ref 96–112)
CO2: 26 mEq/L (ref 19–32)
Calcium: 9.1 mg/dL (ref 8.4–10.5)
Creatinine, Ser: 1.12 mg/dL (ref 0.40–1.50)
GFR: 74.89 mL/min (ref >60.00)
GLUCOSE: 102 mg/dL — AB (ref 70–99)
Potassium: 4.1 mEq/L (ref 3.5–5.1)
Sodium: 138 mEq/L (ref 135–145)
Total Protein: 6.9 g/dL (ref 6.0–8.3)

## 2015-03-02 LAB — LIPID PANEL
Cholesterol: 244 mg/dL — ABNORMAL HIGH (ref 0–200)
HDL: 50.5 mg/dL (ref >39.00)
LDL CALC: 183 mg/dL — AB (ref 0–99)
NonHDL: 193.5
Total CHOL/HDL Ratio: 5
Triglycerides: 51 mg/dL (ref 0.0–149.0)
VLDL: 10.2 mg/dL (ref 0.0–40.0)

## 2015-03-02 LAB — TSH: TSH: 5.06 u[IU]/mL — AB (ref 0.35–4.50)

## 2015-03-07 ENCOUNTER — Encounter: Payer: Self-pay | Admitting: Family Medicine

## 2015-03-07 ENCOUNTER — Ambulatory Visit (INDEPENDENT_AMBULATORY_CARE_PROVIDER_SITE_OTHER): Payer: 59 | Admitting: Family Medicine

## 2015-03-07 VITALS — BP 122/80 | HR 66 | Temp 98.5°F | Wt 198.0 lb

## 2015-03-07 DIAGNOSIS — E039 Hypothyroidism, unspecified: Secondary | ICD-10-CM

## 2015-03-07 DIAGNOSIS — E785 Hyperlipidemia, unspecified: Secondary | ICD-10-CM

## 2015-03-07 MED ORDER — SYNTHROID 100 MCG PO TABS: 100.0000 ug | ORAL_TABLET | Freq: Every day | ORAL | Status: DC

## 2015-03-07 NOTE — Patient Instructions (Signed)
Work on [employer].  Increase your thyroid medicine to 1 tab a day.  Recheck TSH in about 2 months.  Nonfasting lab.  Take care.  Glad to see you.

## 2015-03-07 NOTE — Progress Notes (Signed)
Pre visit review using our clinic review tool, if applicable. No additional management support is needed unless otherwise documented below in the visit note.  He's been helping a neighbor after one had a CVA.  He's working long hours at his job.  He's busy and trying to get by.  His diet has been altered, "eating junk."  HLD noted on labs.  D/w pt about weight/diet and he'll work on both.      Hypothyroidism.  TSH d/w pt.  No ADE on meds.  Had been on 6.5 tabs in 1 week.  Will inc to 1 tab a day and recheck in about 2 months.  He agrees.   Meds, vitals, and allergies reviewed.   ROS: See HPI.  Otherwise, noncontributory.  GEN: nad, alert and oriented HEENT: mucous membranes moist NECK: supple w/o LA, no tmg.   CV: rrr.  PULM: ctab, no inc wob ABD: +bs EXT: no edema

## 2015-03-08 NOTE — Assessment & Plan Note (Signed)
Had been on 6.5 tabs in 1 week.  Will inc to 1 tab a day and recheck in about 2 months.  He agrees.  Orders are in.  No tmg on exam.

## 2015-03-08 NOTE — Assessment & Plan Note (Signed)
He'll work on diet and exercise.  Labs d/w pt.

## 2015-04-23 ENCOUNTER — Other Ambulatory Visit: Payer: Self-pay | Admitting: *Deleted

## 2015-04-23 MED ORDER — SYNTHROID 100 MCG PO TABS: 100.0000 ug | ORAL_TABLET | Freq: Every day | ORAL | Status: DC

## 2015-04-23 NOTE — Telephone Encounter (Signed)
Faxed refill request. Last Filled:    30 tablet 12 RF on 03/07/2015  Refilled and request made to schedule CPE prior to further refills.   Left detailed message on voicemail.

## 2015-06-22 ENCOUNTER — Other Ambulatory Visit (INDEPENDENT_AMBULATORY_CARE_PROVIDER_SITE_OTHER): Payer: 59

## 2015-06-22 ENCOUNTER — Other Ambulatory Visit: Payer: Self-pay | Admitting: Family Medicine

## 2015-06-22 DIAGNOSIS — E039 Hypothyroidism, unspecified: Secondary | ICD-10-CM | POA: Diagnosis not present

## 2015-06-22 LAB — TSH: TSH: 6.62 u[IU]/mL — AB (ref 0.35–4.50)

## 2015-06-26 ENCOUNTER — Other Ambulatory Visit: Payer: Self-pay | Admitting: Family Medicine

## 2015-06-26 DIAGNOSIS — E039 Hypothyroidism, unspecified: Secondary | ICD-10-CM

## 2015-06-26 MED ORDER — LEVOTHYROXINE SODIUM 112 MCG PO TABS: 112.0000 ug | ORAL_TABLET | Freq: Every day | ORAL | Status: DC

## 2015-08-17 ENCOUNTER — Other Ambulatory Visit (INDEPENDENT_AMBULATORY_CARE_PROVIDER_SITE_OTHER): Payer: 59

## 2015-08-17 DIAGNOSIS — E039 Hypothyroidism, unspecified: Secondary | ICD-10-CM | POA: Diagnosis not present

## 2015-08-17 LAB — TSH: TSH: 2.34 u[IU]/mL (ref 0.35–4.50)

## 2015-08-20 ENCOUNTER — Encounter: Payer: Self-pay | Admitting: *Deleted

## 2015-10-10 ENCOUNTER — Ambulatory Visit (INDEPENDENT_AMBULATORY_CARE_PROVIDER_SITE_OTHER): Payer: 59 | Admitting: Family Medicine

## 2015-10-10 ENCOUNTER — Encounter: Payer: Self-pay | Admitting: Family Medicine

## 2015-10-10 VITALS — BP 126/78 | HR 83 | Temp 98.6°F | Ht 69.0 in | Wt 193.0 lb

## 2015-10-10 DIAGNOSIS — J0100 Acute maxillary sinusitis, unspecified: Secondary | ICD-10-CM

## 2015-10-10 DIAGNOSIS — J029 Acute pharyngitis, unspecified: Secondary | ICD-10-CM | POA: Diagnosis not present

## 2015-10-10 DIAGNOSIS — J019 Acute sinusitis, unspecified: Secondary | ICD-10-CM | POA: Insufficient documentation

## 2015-10-10 LAB — POCT RAPID STREP A (OFFICE): Rapid Strep A Screen: NEGATIVE

## 2015-10-10 MED ORDER — AMOXICILLIN-POT CLAVULANATE 875-125 MG PO TABS: 1.0000 | ORAL_TABLET | Freq: Two times a day (BID) | ORAL | Status: DC

## 2015-10-10 NOTE — Patient Instructions (Signed)
For throat and sinus pain  Drink lots of water  Breathe steam  Use nasal saline spray to loosen up sinus congestion (or a netti pot)  Ibuprofen or aleve (with food) for sinus pain and pressure and fever  Take augmentin as directed for sinus infection  Strep test is negative   Stay out of work today and tomorrow - try to get some rest   Update if not starting to improve in a week or if worsening

## 2015-10-10 NOTE — Progress Notes (Signed)
Subjective:    Patient ID: Jose Osborne, male    DOB: Aug 04, 1971, 45 y.o.   MRN: [id number]  HPI  Here with ST and uri symptoms   Last week some sinus pressure  Ear pain and pressure  Prev-some sneezing  This am - severe ST and throat is red  Pressure on face is worse  Sinus drainage - not clear - getting darker  Low grade temp 99 this am   No med otc today  Sometimes takes advil cold and sinus   RST neg today    Patient Active Problem List   Diagnosis Date Noted  . Routine general medical examination at a health care facility 05/13/2013  . HLD (hyperlipidemia) 05/13/2013  . Testicle pain 11/01/2012  . Dry mouth 02/03/2011  . NECK PAIN 05/31/2010  . Hypothyroidism 08/21/2008  . ALLERGIC RHINITIS 08/04/2007   Past Medical History  Diagnosis Date  . Allergy   . GERD (gastroesophageal reflux disease)   . Hypothyroidism   . Hyperlipidemia    No past surgical history on file. Social History  Substance Use Topics  . Smoking status: Never Smoker   . Smokeless tobacco: Former Neurosurgeon    Types: Snuff     Comment: quit 09/16/15  . Alcohol Use: 0.0 oz/week    0 Standard drinks or equivalent per week     Comment: beer, occ   Family History  Problem Relation Age of Onset  . Adopted: Yes  . Alcohol abuse Mother   . Thyroid disease Sister   . Hypertension Brother   . Heart disease Neg Hx    No Known Allergies Current Outpatient Prescriptions on File Prior to Visit  Medication Sig Dispense Refill  . esomeprazole (NEXIUM) 20 MG capsule Take 20 mg by mouth daily before breakfast.    . levothyroxine (SYNTHROID, LEVOTHROID) 112 MCG tablet Take 1 tablet (112 mcg total) by mouth daily. 90 tablet 3   No current facility-administered medications on file prior to visit.    Review of Systems  Constitutional: Positive for appetite change. Negative for fever and fatigue.  HENT: Positive for congestion, ear pain, postnasal drip, rhinorrhea, sinus pressure and sore throat.  Negative for mouth sores and nosebleeds.   Eyes: Negative for pain, redness and itching.  Respiratory: Positive for cough. Negative for shortness of breath and wheezing.   Cardiovascular: Negative for chest pain.  Gastrointestinal: Negative for nausea, vomiting, abdominal pain and diarrhea.  Endocrine: Negative for polyuria.  Genitourinary: Negative for dysuria, urgency and frequency.  Musculoskeletal: Negative for myalgias and arthralgias.  Allergic/Immunologic: Negative for immunocompromised state.  Neurological: Positive for headaches. Negative for dizziness, tremors, syncope, weakness and numbness.  Hematological: Negative for adenopathy. Does not bruise/bleed easily.  Psychiatric/Behavioral: Negative for dysphoric mood. The patient is not nervous/anxious.        Objective:   Physical Exam  Constitutional: He appears well-developed and well-nourished. No distress.  overwt and fatigued appearing  HENT:  Head: Normocephalic and atraumatic.  Right Ear: External ear normal.  Left Ear: External ear normal.  Mouth/Throat: Oropharynx is clear and moist. No oropharyngeal exudate.  Nares are injected and congested  Bilateral maxillary sinus tenderness  Post nasal drip   Throat is erythematous-no exudate or swelling or ulcers   Eyes: Conjunctivae and EOM are normal. Pupils are equal, round, and reactive to light. Right eye exhibits no discharge. Left eye exhibits no discharge.  Neck: Normal range of motion. Neck supple.  Cardiovascular: Normal rate and regular rhythm.  Pulmonary/Chest: Effort normal and breath sounds normal. No respiratory distress. He has no wheezes. He has no rales.  Lymphadenopathy:    He has no cervical adenopathy.  Neurological: He is alert. No cranial nerve deficit.  Skin: Skin is warm and dry. No rash noted.  Psychiatric: He has a normal mood and affect.          Assessment & Plan:   Problem List Items Addressed This Visit      Respiratory   Acute  sinusitis    With uri  Cover with augmentin  For throat and sinus pain  Drink lots of water  Breathe steam  Use nasal saline spray to loosen up sinus congestion (or a netti pot)  Ibuprofen or aleve (with food) for sinus pain and pressure and fever  Take augmentin as directed for sinus infection  Strep test is negative   Stay out of work today and tomorrow - try to get some rest   Update if not starting to improve in a week or if worsening        Relevant Medications   amoxicillin-clavulanate (AUGMENTIN) 875-125 MG tablet    Other Visit Diagnoses    Sore throat    -  Primary    Relevant Orders    Rapid Strep A (Completed)

## 2015-10-10 NOTE — Progress Notes (Signed)
Pre visit review using our clinic review tool, if applicable. No additional management support is needed unless otherwise documented below in the visit note. 

## 2015-10-11 NOTE — Assessment & Plan Note (Signed)
With [employer] with augmentin  For throat and sinus pain  Drink lots of water  Breathe steam  Use nasal saline spray to loosen up sinus congestion (or a netti pot)  Ibuprofen or aleve (with food) for sinus pain and pressure and fever  Take augmentin as directed for sinus infection  Strep test is negative   Stay out of work today and tomorrow - try to get some rest   Update if not starting to improve in a week or if worsening

## 2016-04-26 ENCOUNTER — Other Ambulatory Visit: Payer: Self-pay | Admitting: Family Medicine

## 2016-04-26 DIAGNOSIS — E039 Hypothyroidism, unspecified: Secondary | ICD-10-CM

## 2016-04-27 NOTE — Telephone Encounter (Signed)
Sent.  Due for CPE/recheck tsh in 08/2016.  Thanks.

## 2016-04-28 NOTE — Telephone Encounter (Signed)
Patient advised.

## 2017-04-27 ENCOUNTER — Ambulatory Visit (INDEPENDENT_AMBULATORY_CARE_PROVIDER_SITE_OTHER): Payer: 59 | Admitting: Family Medicine

## 2017-04-27 ENCOUNTER — Encounter: Payer: Self-pay | Admitting: Family Medicine

## 2017-04-27 ENCOUNTER — Telehealth: Payer: Self-pay

## 2017-04-27 VITALS — BP 140/92 | HR 54 | Temp 97.9°F | Wt 208.5 lb

## 2017-04-27 DIAGNOSIS — R220 Localized swelling, mass and lump, head: Secondary | ICD-10-CM

## 2017-04-27 MED ORDER — EPINEPHRINE 0.3 MG/0.3ML IJ SOAJ: 0.3000 mg | Freq: Once | INTRAMUSCULAR | 0 refills | Status: AC

## 2017-04-27 MED ORDER — RANITIDINE HCL 150 MG PO TABS: 150.0000 mg | ORAL_TABLET | Freq: Two times a day (BID) | ORAL | 1 refills | Status: DC

## 2017-04-27 MED ORDER — PREDNISONE 20 MG PO TABS: 20.0000 mg | ORAL_TABLET | Freq: Every day | ORAL | 0 refills | Status: DC

## 2017-04-27 MED ORDER — LORATADINE 10 MG PO TABS: 10.0000 mg | ORAL_TABLET | Freq: Every day | ORAL | 1 refills | Status: DC

## 2017-04-27 NOTE — Telephone Encounter (Signed)
Patient called back stating that [facility] has a generic epi-pen, but his insurance will not cover it. Patient wants advice as to what to do?

## 2017-04-27 NOTE — Assessment & Plan Note (Signed)
Avoid nuts/nutrigrain bars for now.  Unclear if that was the trigger.  No airway sx, he feels some better in the meantime.  Okay for outpatient f/u with strict precautions given.   Start claritin with zantac.  Take prednisone daily with food.  If any progressive swelling, then use the epipen and dial 911/go to ER.  He'll update me tomorrow.  Refer to allergy clinic.  He agrees.  D/w pt about possible allergy vs angioedema dx and he agrees with plan.  He has mild enough sx to allow for outpatient f/u with strict cautions given.

## 2017-04-27 NOTE — Telephone Encounter (Signed)
Patient notified and [facility] but will come by to pick up the coupon and see if they will honor it.  Coupon left at front desk for pick up.

## 2017-04-27 NOTE — Patient Instructions (Signed)
Start claritin- you can get it over the counter.  Take zantac- it may be cheaper over the counter.  Take prednisone daily with food.  If you have progressive swelling, then use the epipen and dial 911/go to ER.  Update me tomorrow.  Shirlee LimerickMarion will call about your referral. Take care.  Glad to see you.  Avoid nuts/nutrigrain bars for now.  Unclear if that was the trigger.

## 2017-04-27 NOTE — Telephone Encounter (Signed)
Pt said [facility] did not have the Epi-pen. I advised him to ask around and have the rx transferred.

## 2017-04-27 NOTE — Telephone Encounter (Addendum)
I went on goodrx.com to try to get patient a coupon for a different pharmacy.  Printed. See if that helps.  And he can check that website if needed.  Thanks.

## 2017-04-27 NOTE — Progress Notes (Signed)
Lower lip swelling, on the L side.  Started this AM.  Some better now.  Not on ACE, ARB.  He is not dipping for 1.5 years.  Locally puffy, just lateral to the junction of the lips on the L side.  No FCNAVD.  No tongue swelling or pain.  No voice changes except for occ due to cough/GERD.  Some sneezing yesterday.  No wheeze.  Not SOB.  Still swallowing well.  No trauma.  He had a nutrigrain bar last night.  He had had them prior but not in a long time.  D/w pt about avoidance for now, avoiding nuts.  He had a tick bite on the R leg earlier this summer.  He had pork and potatoes last night, and that is typical for patient.    He has been travelling to [place] back and forth daily for work but that is better now.    He had surgery on his hand, s/p rehab.    Meds, vitals, and allergies reviewed.   ROS: Per HPI unless specifically indicated in ROS section   nad ncat TM wnl B Nasal and OP exam wnl except for L lower lip laterally mildly swollen with fluctuant mass or tenderness, no ulceration or mass o/w. No other lip swelling.  No tongue swelling.  No oral lesions seen.  Neck supple, no stridor, voice sounds normal. No LA rrr Ctab, no wheeze.  abd soft Ext w/o edema.  Skin w/o rash.

## 2017-04-28 NOTE — Telephone Encounter (Signed)
Pt called to give update. He is better, everything is working fine and thank you.

## 2017-04-28 NOTE — Telephone Encounter (Signed)
Noted, glad to hear he is better.  Thanks for update.

## 2017-05-20 ENCOUNTER — Other Ambulatory Visit: Payer: Self-pay | Admitting: Family Medicine

## 2017-05-20 DIAGNOSIS — E039 Hypothyroidism, unspecified: Secondary | ICD-10-CM

## 2017-06-17 ENCOUNTER — Other Ambulatory Visit: Payer: Self-pay | Admitting: Family Medicine

## 2017-06-17 DIAGNOSIS — E039 Hypothyroidism, unspecified: Secondary | ICD-10-CM

## 2017-06-18 NOTE — Telephone Encounter (Signed)
Electronic refill request. Synthroid Last office visit:   04/27/17 Last labs TSH 2016 Last Filled:   With notation to schedule CPE and labs - No appts scheduled 30 tablet 0 05/20/2017  Please advise.

## 2017-06-19 DIAGNOSIS — J300 Vasomotor rhinitis: Secondary | ICD-10-CM | POA: Diagnosis not present

## 2017-06-19 DIAGNOSIS — T783XXA Angioneurotic edema, initial encounter: Secondary | ICD-10-CM | POA: Diagnosis not present

## 2017-06-19 NOTE — Telephone Encounter (Signed)
PT called to ck status. He scheduled appt with labs prior 10/12 & 10/19. He is requesting a cb when sent in.

## 2017-06-19 NOTE — Telephone Encounter (Signed)
Send in if he'll schedule f/u.  Thanks.

## 2017-06-26 ENCOUNTER — Other Ambulatory Visit (INDEPENDENT_AMBULATORY_CARE_PROVIDER_SITE_OTHER): Payer: 59

## 2017-06-26 ENCOUNTER — Other Ambulatory Visit: Payer: Self-pay | Admitting: Family Medicine

## 2017-06-26 DIAGNOSIS — E785 Hyperlipidemia, unspecified: Secondary | ICD-10-CM | POA: Diagnosis not present

## 2017-06-26 DIAGNOSIS — E039 Hypothyroidism, unspecified: Secondary | ICD-10-CM | POA: Diagnosis not present

## 2017-06-26 LAB — COMPREHENSIVE METABOLIC PANEL
ALT: 61 U/L — AB (ref 0–53)
AST: 36 U/L (ref 0–37)
Albumin: 4.6 g/dL (ref 3.5–5.2)
Alkaline Phosphatase: 75 U/L (ref 39–117)
BILIRUBIN TOTAL: 0.5 mg/dL (ref 0.2–1.2)
BUN: 19 mg/dL (ref 6–23)
CO2: 22 meq/L (ref 19–32)
Calcium: 8.5 mg/dL (ref 8.4–10.5)
Chloride: 107 mEq/L (ref 96–112)
Creatinine, Ser: 1.19 mg/dL (ref 0.40–1.50)
GFR: 69.14 mL/min (ref >60.00)
GLUCOSE: 99 mg/dL (ref 70–99)
Potassium: 4.1 mEq/L (ref 3.5–5.1)
SODIUM: 139 meq/L (ref 135–145)
Total Protein: 7.3 g/dL (ref 6.0–8.3)

## 2017-06-26 LAB — TSH: TSH: 6.08 u[IU]/mL — AB (ref 0.35–4.50)

## 2017-06-26 LAB — LIPID PANEL
Cholesterol: 308 mg/dL — ABNORMAL HIGH (ref 0–200)
HDL: 41.8 mg/dL (ref >39.00)
NONHDL: 266.5
Total CHOL/HDL Ratio: 7
Triglycerides: 306 mg/dL — ABNORMAL HIGH (ref 0.0–149.0)
VLDL: 61.2 mg/dL — ABNORMAL HIGH (ref 0.0–40.0)

## 2017-06-26 LAB — LDL CHOLESTEROL, DIRECT: Direct LDL: 204 mg/dL

## 2017-07-03 ENCOUNTER — Ambulatory Visit (INDEPENDENT_AMBULATORY_CARE_PROVIDER_SITE_OTHER): Payer: 59 | Admitting: Family Medicine

## 2017-07-03 ENCOUNTER — Encounter: Payer: Self-pay | Admitting: Family Medicine

## 2017-07-03 VITALS — BP 136/90 | HR 61 | Temp 98.4°F | Ht 69.0 in | Wt 208.4 lb

## 2017-07-03 DIAGNOSIS — E785 Hyperlipidemia, unspecified: Secondary | ICD-10-CM

## 2017-07-03 DIAGNOSIS — R7989 Other specified abnormal findings of blood chemistry: Secondary | ICD-10-CM

## 2017-07-03 DIAGNOSIS — R220 Localized swelling, mass and lump, head: Secondary | ICD-10-CM | POA: Diagnosis not present

## 2017-07-03 DIAGNOSIS — E039 Hypothyroidism, unspecified: Secondary | ICD-10-CM

## 2017-07-03 DIAGNOSIS — R945 Abnormal results of liver function studies: Secondary | ICD-10-CM

## 2017-07-03 MED ORDER — ATORVASTATIN CALCIUM 10 MG PO TABS: 10.0000 mg | ORAL_TABLET | Freq: Every day | ORAL | 3 refills | Status: DC

## 2017-07-03 MED ORDER — LEVOTHYROXINE SODIUM 125 MCG PO TABS: 125.0000 ug | ORAL_TABLET | Freq: Every day | ORAL | 0 refills | Status: DC

## 2017-07-03 NOTE — Patient Instructions (Signed)
Recheck labs in about 2 months, fasting.  Start lipitor.  If aches, then stop the med and update me.  Higher dose of thyroid medicine.  Taper/cut back on alcohol.  Take care.  Glad to see you.

## 2017-07-03 NOTE — Progress Notes (Signed)
He had another episode of lip swelling after eating triscuits.  He is carrying an epipen.  He notified the allergy clinic in the meantime.  This episode was not as severe as the prev episode.  He had the triscuits the night before the episode and didn't have sx.  D/w pt about epi pen cautions and lip swelling/angioedema pathophysiology.  Hypothyroidism.  TSH up, d/w pt. No neck mass, no dysphagia.  Still on med.  No ADE on med.  He takes med alone to limit other meds interfering.    Elevated Cholesterol: Willing to start statin.   Labs d/w pt.   Significantly elevated LDL. Discussed with patient about diet and exercise.  LFT d/w pt.  he has a few alcoholic drinks daily. D/w pt about etoh taper.  Advised him not to quit cold [place].  PMH and SH reviewed  ROS: Per HPI unless specifically indicated in ROS section  No FCNAVD.  No CP, not SOB. No abd pain.  No swelling currently.   Meds, vitals, and allergies reviewed.   GEN: nad, alert and oriented HEENT: mucous membranes moist NECK: supple w/o LA, no tmg.  CV: rrr.  PULM: ctab, no inc wob ABD: soft, +bs EXT: no edema SKIN: no acute rash

## 2017-07-05 DIAGNOSIS — R945 Abnormal results of liver function studies: Secondary | ICD-10-CM

## 2017-07-05 DIAGNOSIS — R7989 Other specified abnormal findings of blood chemistry: Secondary | ICD-10-CM | POA: Insufficient documentation

## 2017-07-05 NOTE — Assessment & Plan Note (Signed)
Advise to taper alcohol but not quit cold [place]. Rationale discussed with patient. Labs discussed with patient.

## 2017-07-05 NOTE — Assessment & Plan Note (Signed)
Increased replacement to 125 mcg per day. Recheck TSH in a few months. Rationale discussed with patient. He agrees.

## 2017-07-05 NOTE — Assessment & Plan Note (Signed)
Start Lipitor 10 mg a day. Routine cautions given. Return for follow-up labs as scheduled. Discussed with patient. He agrees. Needs work on diet and exercise.

## 2017-07-05 NOTE — Assessment & Plan Note (Signed)
Lip swelling/angioedema pathophysiology discussed with patient. Routine cautions given. Continue to carry EpiPen and keep a food diary in the meantime.

## 2017-07-17 ENCOUNTER — Other Ambulatory Visit: Payer: Self-pay | Admitting: Family Medicine

## 2017-09-04 ENCOUNTER — Other Ambulatory Visit (INDEPENDENT_AMBULATORY_CARE_PROVIDER_SITE_OTHER): Payer: 59

## 2017-09-04 DIAGNOSIS — E785 Hyperlipidemia, unspecified: Secondary | ICD-10-CM

## 2017-09-04 DIAGNOSIS — E039 Hypothyroidism, unspecified: Secondary | ICD-10-CM | POA: Diagnosis not present

## 2017-09-04 LAB — LIPID PANEL
CHOLESTEROL: 121 mg/dL (ref 0–200)
HDL: 48 mg/dL (ref >39.00)
LDL Cholesterol: 65 mg/dL (ref 0–99)
NonHDL: 73.15
TRIGLYCERIDES: 40 mg/dL (ref 0.0–149.0)
Total CHOL/HDL Ratio: 3
VLDL: 8 mg/dL (ref 0.0–40.0)

## 2017-09-04 LAB — COMPREHENSIVE METABOLIC PANEL
ALBUMIN: 4.3 g/dL (ref 3.5–5.2)
ALK PHOS: 53 U/L (ref 39–117)
ALT: 50 U/L (ref 0–53)
AST: 31 U/L (ref 0–37)
BILIRUBIN TOTAL: 1 mg/dL (ref 0.2–1.2)
BUN: 14 mg/dL (ref 6–23)
CALCIUM: 8.8 mg/dL (ref 8.4–10.5)
CO2: 29 meq/L (ref 19–32)
CREATININE: 1.18 mg/dL (ref 0.40–1.50)
Chloride: 106 mEq/L (ref 96–112)
GFR: 69.77 mL/min (ref >60.00)
Glucose, Bld: 93 mg/dL (ref 70–99)
Potassium: 4.1 mEq/L (ref 3.5–5.1)
Sodium: 139 mEq/L (ref 135–145)
TOTAL PROTEIN: 6.8 g/dL (ref 6.0–8.3)

## 2017-09-04 LAB — TSH: TSH: 3.02 u[IU]/mL (ref 0.35–4.50)

## 2017-09-07 ENCOUNTER — Telehealth: Payer: Self-pay | Admitting: Family Medicine

## 2017-09-07 NOTE — Telephone Encounter (Signed)
Called in and was given his results.  TSH wnl.  Lipids much improved.  Other labs fine.  Liver tests are normal.  Would continue as is.   Thanks.  He has lost 25 lbs.  I congratulated him on his wt loss and encouraged him to keep it up.

## 2017-09-27 ENCOUNTER — Other Ambulatory Visit: Payer: Self-pay | Admitting: Family Medicine

## 2017-09-27 DIAGNOSIS — E039 Hypothyroidism, unspecified: Secondary | ICD-10-CM

## 2018-08-15 ENCOUNTER — Other Ambulatory Visit: Payer: Self-pay | Admitting: Family Medicine

## 2018-08-15 DIAGNOSIS — E785 Hyperlipidemia, unspecified: Secondary | ICD-10-CM

## 2018-08-15 DIAGNOSIS — E039 Hypothyroidism, unspecified: Secondary | ICD-10-CM

## 2018-08-20 ENCOUNTER — Other Ambulatory Visit (INDEPENDENT_AMBULATORY_CARE_PROVIDER_SITE_OTHER): Payer: 59

## 2018-08-20 DIAGNOSIS — E785 Hyperlipidemia, unspecified: Secondary | ICD-10-CM

## 2018-08-20 DIAGNOSIS — E039 Hypothyroidism, unspecified: Secondary | ICD-10-CM

## 2018-08-20 LAB — LIPID PANEL
CHOL/HDL RATIO: 4
Cholesterol: 208 mg/dL — ABNORMAL HIGH (ref 0–200)
HDL: 59.4 mg/dL (ref >39.00)
LDL CALC: 139 mg/dL — AB (ref 0–99)
NONHDL: 149.09
Triglycerides: 50 mg/dL (ref 0.0–149.0)
VLDL: 10 mg/dL (ref 0.0–40.0)

## 2018-08-20 LAB — COMPREHENSIVE METABOLIC PANEL
ALT: 26 U/L (ref 0–53)
AST: 21 U/L (ref 0–37)
Albumin: 4.4 g/dL (ref 3.5–5.2)
Alkaline Phosphatase: 51 U/L (ref 39–117)
BUN: 19 mg/dL (ref 6–23)
CALCIUM: 9 mg/dL (ref 8.4–10.5)
CO2: 29 meq/L (ref 19–32)
Chloride: 106 mEq/L (ref 96–112)
Creatinine, Ser: 1.12 mg/dL (ref 0.40–1.50)
GFR: 73.8 mL/min (ref >60.00)
Glucose, Bld: 97 mg/dL (ref 70–99)
POTASSIUM: 4.6 meq/L (ref 3.5–5.1)
Sodium: 141 mEq/L (ref 135–145)
Total Bilirubin: 0.9 mg/dL (ref 0.2–1.2)
Total Protein: 6.8 g/dL (ref 6.0–8.3)

## 2018-08-20 LAB — TSH: TSH: 5.46 u[IU]/mL — ABNORMAL HIGH (ref 0.35–4.50)

## 2018-08-27 ENCOUNTER — Ambulatory Visit (INDEPENDENT_AMBULATORY_CARE_PROVIDER_SITE_OTHER): Payer: 59 | Admitting: Family Medicine

## 2018-08-27 ENCOUNTER — Encounter: Payer: Self-pay | Admitting: Family Medicine

## 2018-08-27 VITALS — BP 130/84 | HR 59 | Temp 98.4°F | Ht 69.0 in | Wt 182.2 lb

## 2018-08-27 DIAGNOSIS — Z23 Encounter for immunization: Secondary | ICD-10-CM | POA: Diagnosis not present

## 2018-08-27 DIAGNOSIS — E785 Hyperlipidemia, unspecified: Secondary | ICD-10-CM

## 2018-08-27 DIAGNOSIS — Z7189 Other specified counseling: Secondary | ICD-10-CM

## 2018-08-27 DIAGNOSIS — Z0000 Encounter for general adult medical examination without abnormal findings: Secondary | ICD-10-CM

## 2018-08-27 DIAGNOSIS — E039 Hypothyroidism, unspecified: Secondary | ICD-10-CM

## 2018-08-27 DIAGNOSIS — R220 Localized swelling, mass and lump, head: Secondary | ICD-10-CM

## 2018-08-27 MED ORDER — LEVOTHYROXINE SODIUM 137 MCG PO TABS: 137.0000 ug | ORAL_TABLET | Freq: Every day | ORAL | 12 refills | Status: DC

## 2018-08-27 MED ORDER — EPINEPHRINE 0.3 MG/0.3ML IJ SOAJ: 0.3000 mg | Freq: Once | INTRAMUSCULAR | 1 refills | Status: DC

## 2018-08-27 NOTE — Progress Notes (Signed)
CPE- See plan.  Routine anticipatory guidance given to patient.  See health maintenance.  The possibility exists that previously documented standard health maintenance information may have been brought forward from a previous encounter into this note.  If needed, that same information has been updated to reflect the current situation based on today's encounter.    Tetanus 2017.  Flu shot today.  Diet and exercise discussed.  Living will d/w pt.  Encouraged.  Wife designated if he is incapacitated.  Labs d/w pt.   D/w patient [id number]:options for colon cancer screening, including IFOB vs. colonoscopy.  Risks and benefits of both were discussed and patient voiced understanding.  Pt elects to consider options.   PSA not due now.  D/w pt.   He is down to 172 at home- he weighed with boots on at the clinic.  Intentional weight loss.  He is off cholesterol medicine.  He worked on diet and exercise.   [place] working in [place] commuting back and forth.  He is tolerating the commute.    He has some minimal right sided upper rectus abdominis discomfort.  He thought it could have been from having pulled muscle.  Episodically noted.  It seems to be positional when exacerbated.  Hypothyroidism.  TSH minimally elevated.  Compliant.    He had allergy clinic eval. We talked about having epi pen on hand in case he had troubles.    He adopted a Geophysical data processorstray dog recently.  D/w pt.    PMH and SH reviewed  Meds, vitals, and allergies reviewed.   ROS: Per HPI.  Unless specifically indicated otherwise in HPI, the patient denies:  General: fever. Eyes: acute vision changes ENT: sore throat Cardiovascular: chest pain Respiratory: SOB GI: vomiting GU: dysuria Musculoskeletal: acute back pain Derm: acute rash Neuro: acute motor dysfunction Psych: worsening mood Endocrine: polydipsia Heme: bleeding Allergy: hayfever  GEN: nad, alert and oriented HEENT: mucous membranes moist NECK: supple w/o LA CV:  rrr. PULM: ctab, no inc wob ABD: soft, +bs, very minimally tender on the superior part of the right side of the rectus abdominis.  He does not have typical right upper quadrant pain.  This appears to be limited to the abdominal wall (I asked patient to observe this and update me if he had further symptoms but it seems to be a benign issue-he agrees) EXT: no edema SKIN: no acute rash

## 2018-08-27 NOTE — Patient Instructions (Addendum)
Try checking on goodrx.com for a coupon for the epipen.   Check to see if your insurance will cover cologuard.  If so, and if you want to do that, then let me know.  Or we can send you for a colonoscopy.    Thank you for your effort.    New dose thyroid medicine.  Recheck tsh at lab visit in about 2-3 months.   Take care.  Glad to see you.

## 2018-08-29 DIAGNOSIS — Z7189 Other specified counseling: Secondary | ICD-10-CM | POA: Insufficient documentation

## 2018-08-29 NOTE — Assessment & Plan Note (Signed)
Tetanus 2017.  Flu shot today.  Diet and exercise discussed.  Living will d/w pt.  Encouraged.  Wife designated if he is incapacitated.  Labs d/w pt.   D/w patient [id number]:options for colon cancer screening, including IFOB vs. colonoscopy.  Risks and benefits of both were discussed and patient voiced understanding.  Pt elects to consider options.   PSA not due now.  D/w pt.   He is down to 172 at home- he weighed with boots on at the clinic.  Intentional weight loss.  He is off cholesterol medicine.  He worked on diet and exercise.   [place] in [place] but commuting back and forth.  He is tolerating the commute.

## 2018-08-29 NOTE — Assessment & Plan Note (Signed)
Living will d/w pt. Encouraged. Wife designated if he is incapacitated.  

## 2018-08-29 NOTE — Assessment & Plan Note (Signed)
Off statin.  Doing well.  Continue work on diet and exercise.  I thanked him for his effort.

## 2018-08-29 NOTE — Assessment & Plan Note (Signed)
Increase levothyroxine to 137 mcg.  Recheck TSH in about 2 to 3 months.  He agrees.  See after visit summary.  No thyromegaly on exam.

## 2018-08-29 NOTE — Assessment & Plan Note (Signed)
He had allergy clinic evaluation.  No clear trigger identified.  We talked about having EpiPen on hand in case he had any troubles in the future.  Routine cautions given.  He agrees.

## 2019-01-17 ENCOUNTER — Ambulatory Visit (INDEPENDENT_AMBULATORY_CARE_PROVIDER_SITE_OTHER): Payer: 59 | Admitting: Family Medicine

## 2019-01-17 DIAGNOSIS — M62838 Other muscle spasm: Secondary | ICD-10-CM | POA: Diagnosis not present

## 2019-01-17 MED ORDER — BACLOFEN 10 MG PO TABS: 5.0000 mg | ORAL_TABLET | Freq: Three times a day (TID) | ORAL | 0 refills | Status: DC | PRN

## 2019-01-17 NOTE — Progress Notes (Signed)
Virtual visit completed through WebEx or similar program Patient location: home  Provider location: [place] at [facility], office   Limitations and rationale for visit method d/w patient.  Patient agreed to proceed.   CC: pain.   HPI: Having pain below the right shoulder and in his back. Not acute, is ongoing.  Thinks it may be muscle. No known Injury.  Wife had been rubbing the area, when it knotted up.  No sx on the L side.  Aleve didn't help.  No radicular sx.  No rash, no bruising.  He is working in [place] every day, commuting, driving 1537 miles a week.  Job stressors noted.  He is working doing repetitive tasks (ex: Soil scientist).    Pandemic considerations d/w pt.   Meds and allergies reviewed.   ROS: Per HPI unless specifically indicated in ROS section   NAD Speech wnl  A/P:  Likely muscle spasms.  D/w pt about heat, can try OTC icy hot patches or similar, massage with a tennis ball prn at work. Use baclofen prn with sedation caution.  He is trying to adjust his seat for commute back and forth to work.  No ominous sx.  Update me as needed.  He agrees.

## 2019-01-17 NOTE — Assessment & Plan Note (Signed)
Likely muscle spasms.  D/w pt about heat, can try OTC icy hot patches or similar, massage with a tennis ball prn at work. Use baclofen prn with sedation caution.  He is trying to adjust his seat for commute back and forth to work.  No ominous sx.  Update me as needed.  He agrees.

## 2019-02-12 ENCOUNTER — Other Ambulatory Visit: Payer: Self-pay | Admitting: Family Medicine

## 2019-02-14 NOTE — Telephone Encounter (Signed)
Electronic refill request. Baclofen Last office visit:   01/17/2019 Last Filled:    30 each 0 01/17/2019  Please advise.

## 2019-02-15 NOTE — Telephone Encounter (Signed)
Sent.  Please get update on patient.  Thanks.  ?

## 2019-02-15 NOTE — Telephone Encounter (Signed)
Pt returned call, pt aware of the rx being sent in. Pt states he has good and bad days but the pain has been a lot better. Pt states that he has only been taking on baclofen a day.

## 2019-02-15 NOTE — Telephone Encounter (Signed)
Left pt VM to call back, need to inform of rx refill and get an update on how patient is.

## 2019-02-16 NOTE — Telephone Encounter (Signed)
Noted.  Glad he is doing better.  Thanks.

## 2019-03-17 ENCOUNTER — Other Ambulatory Visit: Payer: Self-pay | Admitting: Family Medicine

## 2019-03-17 NOTE — Telephone Encounter (Signed)
Electronic refill request Baclofen Last refill 02/15/19 #30 Last office visit 01/17/19

## 2019-03-19 NOTE — Telephone Encounter (Signed)
Sent. Thanks.   

## 2019-03-21 ENCOUNTER — Encounter: Payer: Self-pay | Admitting: Family Medicine

## 2019-03-21 ENCOUNTER — Telehealth: Payer: Self-pay | Admitting: *Deleted

## 2019-03-21 ENCOUNTER — Ambulatory Visit (INDEPENDENT_AMBULATORY_CARE_PROVIDER_SITE_OTHER): Payer: 59 | Admitting: Family Medicine

## 2019-03-21 VITALS — Temp 100.4°F | Wt 171.0 lb

## 2019-03-21 DIAGNOSIS — R51 Headache: Secondary | ICD-10-CM

## 2019-03-21 DIAGNOSIS — Z20822 Contact with and (suspected) exposure to covid-19: Secondary | ICD-10-CM

## 2019-03-21 NOTE — Telephone Encounter (Signed)
Pt scheduled for covid testing 03/22/19 @ [facility] @ 9:15. Instructions given and order placed 9:15 am

## 2019-03-21 NOTE — Progress Notes (Signed)
I connected with Jose Osborne on 03/21/19 at  9:40 AM EDT by video and verified that I am speaking with the correct person using two identifiers.   I discussed the limitations, risks, security and privacy concerns of performing an evaluation and management service by video and the availability of in person appointments. I also discussed with the patient that there may be a patient responsible charge related to this service. The patient expressed understanding and agreed to proceed.  Patient location: Home Provider Location: [facility] Participants: Lynnda ChildJessica R  and Jose Evensavid W Croston   Subjective:     Jose EvensDavid W Malina is a 48 y.o. male presenting for Sinus Problem (sx started on 03/18/2019. Sinus congestion, temp 102, chills, sweats, headache, had some ear pain. )     Sinus Problem This is a new problem. The current episode started in the past 7 days. The problem has been gradually improving since onset. The maximum temperature recorded prior to his arrival was 102 - 102.9 F. Associated symptoms include chills, ear pain, headaches and sinus pressure. Pertinent negatives include no coughing, shortness of breath or sore throat. Past treatments include nothing.   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   Did have some cases of COVID but was 3-4 weeks ago No recent sick contact Wearing masks on the mask  Review of Systems  Constitutional: Positive for chills.  HENT: Positive for dental problem (dental pain), ear pain, sinus pressure and sinus pain. Negative for postnasal drip and sore throat.   Respiratory: Negative for cough, chest tightness and shortness of breath.   Cardiovascular: Negative for chest pain.  Gastrointestinal: Negative for diarrhea, nausea and vomiting.  Musculoskeletal: Positive for arthralgias and myalgias.  Neurological: Positive for headaches.     Social History   Tobacco Use  Smoking Status Never  Smoker  Smokeless Tobacco Former NeurosurgeonUser  . Types: Snuff  Tobacco Comment   quit 09/16/15        Objective:   BP Readings from Last 3 Encounters:  08/27/18 130/84  07/03/17 136/90  04/27/17 (!) 140/92   Wt Readings from Last 3 Encounters:  03/21/19 171 lb (77.6 kg)  08/27/18 182 lb 4 oz (82.7 kg)  07/03/17 208 lb 6.4 oz (94.5 kg)    Temp (!) 100.4 F (38 C) Comment: per patient  Wt 171 lb (77.6 kg) Comment: per patient  BMI 25.25 kg/m    Physical Exam Constitutional:      Appearance: Normal appearance. He is not ill-appearing.  HENT:     Head: Normocephalic and atraumatic.     Right Ear: External ear normal.     Left Ear: External ear normal.  Eyes:     Conjunctiva/sclera: Conjunctivae normal.  Pulmonary:     Effort: Pulmonary effort is normal. No respiratory distress.  Neurological:     Mental Status: He is alert. Mental status is at baseline.  Psychiatric:        Mood and Affect: Mood normal.        Behavior: Behavior normal.        Thought Content: Thought content normal.        Judgment: Judgment normal.            Assessment & Plan:   Problem List Items Addressed This Visit    None    Visit Diagnoses    Suspected Covid-19 Virus Infection    -  Primary  Advised OTC treatment for sinus - neti pot, flonase, decongestant If not improving could try antibiotics, but at this time with body aches suspect viral infection Return/ER precautions discussed for Covid-19 Told to stay at home until results back or longer if positive PEC referral for testing   Return if symptoms worsen or fail to improve.  Lesleigh Noe, MD

## 2019-03-21 NOTE — Telephone Encounter (Signed)
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

## 2019-03-22 ENCOUNTER — Other Ambulatory Visit: Payer: Self-pay

## 2019-03-22 DIAGNOSIS — Z20822 Contact with and (suspected) exposure to covid-19: Secondary | ICD-10-CM

## 2019-03-26 LAB — NOVEL CORONAVIRUS, NAA: SARS-CoV-2, NAA: NOT DETECTED

## 2019-04-04 ENCOUNTER — Telehealth: Payer: Self-pay | Admitting: Family Medicine

## 2019-04-04 MED ORDER — AMOXICILLIN-POT CLAVULANATE 875-125 MG PO TABS: 1.0000 | ORAL_TABLET | Freq: Two times a day (BID) | ORAL | 0 refills | Status: DC

## 2019-04-04 NOTE — Telephone Encounter (Signed)
Patient advised.

## 2019-04-04 NOTE — Telephone Encounter (Signed)
Spoken to patient and he was seen by Dr Einar Pheasant on 03/21/2019.  Patient stated that he is starting feel like he has a sinus infection again. Patient thinks that it did not really went away. Patient is having sweats, headache, nasal congestion, and sinus pressure. No fever. No cough. No SOB/wheezing. Please advise.

## 2019-04-04 NOTE — Telephone Encounter (Signed)
I looked back at the note from Dr. Einar Pheasant.   Would start augmentin and f/u if not better or if worse in the meantime.  Thanks.

## 2019-04-10 ENCOUNTER — Other Ambulatory Visit: Payer: Self-pay | Admitting: Family Medicine

## 2019-04-21 ENCOUNTER — Ambulatory Visit: Payer: 59 | Admitting: Family Medicine

## 2019-04-21 ENCOUNTER — Other Ambulatory Visit: Payer: Self-pay

## 2019-04-21 ENCOUNTER — Encounter: Payer: Self-pay | Admitting: Family Medicine

## 2019-04-21 VITALS — BP 96/70 | HR 69 | Temp 98.3°F | Ht 69.0 in | Wt 176.0 lb

## 2019-04-21 DIAGNOSIS — R300 Dysuria: Secondary | ICD-10-CM | POA: Diagnosis not present

## 2019-04-21 DIAGNOSIS — N410 Acute prostatitis: Secondary | ICD-10-CM

## 2019-04-21 LAB — POC URINALSYSI DIPSTICK (AUTOMATED)
Bilirubin, UA: NEGATIVE
Glucose, UA: NEGATIVE
Ketones, UA: NEGATIVE
Nitrite, UA: NEGATIVE
Protein, UA: NEGATIVE
Spec Grav, UA: 1.01 (ref 1.010–1.025)
Urobilinogen, UA: 0.2 E.U./dL
pH, UA: 6 (ref 5.0–8.0)

## 2019-04-21 LAB — POCT UA - MICROSCOPIC ONLY

## 2019-04-21 MED ORDER — CIPROFLOXACIN HCL 500 MG PO TABS: 500.0000 mg | ORAL_TABLET | Freq: Two times a day (BID) | ORAL | 1 refills | Status: DC

## 2019-04-21 NOTE — Addendum Note (Signed)
Addended by: Tammi Sou on: 04/21/2019 02:39 PM   Modules accepted: Orders

## 2019-04-21 NOTE — Progress Notes (Signed)
Chief Complaint  Patient presents with  . Urinary Frequency and Dysuria    Started yesterday with frequency and dysuria. Not fully emptying bladder.    History of Present Illness: Urinary Tract Infection  This is a new problem. The current episode started yesterday. The problem has been waxing and waning. The quality of the pain is described as aching. The pain is moderate. There has been no fever. He is sexually active (wife with yeast infection). Associated symptoms include frequency, hesitancy and urgency. Pertinent negatives include no chills, discharge, flank pain, nausea or vomiting. Associated symptoms comments: Low abd pain and pressure   decreased urine flow   no redness at opening of penis. He has tried NSAIDs and increased fluids for the symptoms. The treatment provided moderate relief. There is no history of catheterization, kidney stones, recurrent UTIs, a single kidney, urinary stasis or a urological procedure.   Urinalysis    Component Value Date/Time   BILIRUBINUR negative 04/21/2019 0955   PROTEINUR Negative 04/21/2019 0955   UROBILINOGEN 0.2 04/21/2019 0955   NITRITE negative 04/21/2019 0955   LEUKOCYTESUR Large (3+) (A) 04/21/2019 0955    pressure in perineal area... when lesnoing forwar   COVID 19 screen No recent travel or known exposure to COVID19 The patient denies respiratory symptoms of COVID 19 at this time.  The importance of social distancing was discussed today.   Review of Systems  Constitutional: Negative for chills and fever.  HENT: Negative for congestion and ear pain.   Eyes: Negative for pain and redness.  Respiratory: Negative for cough and shortness of breath.   Cardiovascular: Negative for chest pain, palpitations and leg swelling.  Gastrointestinal: Negative for abdominal pain, blood in stool, constipation, diarrhea, nausea and vomiting.  Genitourinary: Positive for frequency, hesitancy and urgency. Negative for dysuria and flank pain.   Musculoskeletal: Negative for falls and myalgias.  Skin: Negative for rash.  Neurological: Negative for dizziness.  Psychiatric/Behavioral: Negative for depression. The patient is not nervous/anxious.       Past Medical History:  Diagnosis Date  . Allergy   . GERD (gastroesophageal reflux disease)   . Hyperlipidemia   . Hypothyroidism     reports that he has never smoked. He has quit using smokeless tobacco.  His smokeless tobacco use included snuff. He reports current alcohol use. He reports that he does not use drugs.     Observations/Objective: Blood pressure 96/70, pulse 69, temperature 98.3 F (36.8 C), height 5\' 9"  (1.753 m), weight 176 lb (79.8 kg), SpO2 100 %.  Physical Exam Constitutional:      Appearance: He is well-developed.  HENT:     Head: Normocephalic.     Right Ear: Hearing normal.     Left Ear: Hearing normal.     Nose: Nose normal.  Neck:     Thyroid: No thyroid mass or thyromegaly.     Vascular: No carotid bruit.     Trachea: Trachea normal.  Cardiovascular:     Rate and Rhythm: Normal rate and regular rhythm.     Pulses: Normal pulses.     Heart sounds: Heart sounds not distant. No murmur. No friction rub. No gallop.      Comments: No peripheral edema Pulmonary:     Effort: Pulmonary effort is normal. No respiratory distress.     Breath sounds: Normal breath sounds.  Abdominal:     Tenderness: There is abdominal tenderness in the suprapubic area. There is no right CVA tenderness, left CVA tenderness, guarding or  rebound.     Hernia: No hernia is present.     Comments:  ttp in perineum  Skin:    General: Skin is warm and dry.     Findings: No rash.  Psychiatric:        Speech: Speech normal.        Behavior: Behavior normal.        Thought Content: Thought content normal.      Assessment and Plan   Prostatitis, acute  Send urine for culture. Treat with antibiotics, prolongued course, repeat if symptoms continued.. Reviewed return  precautions. Eliezer Lofts, MD

## 2019-04-21 NOTE — Assessment & Plan Note (Addendum)
Send urine for culture. Treat with antibiotics, prolongued course, repeat if symptoms continued.. Reviewed return precautions.

## 2019-04-21 NOTE — Patient Instructions (Addendum)
Increase water intake. Start and complete antibiotics x 15 days... if symptoms not resolved at end of 15 days.. repeat the course.  We will call with culture results.  Call if fever or worsening pain.

## 2019-04-22 ENCOUNTER — Other Ambulatory Visit: Payer: Self-pay | Admitting: Family Medicine

## 2019-04-23 LAB — URINE CULTURE
MICRO NUMBER:: 744258
SPECIMEN QUALITY:: ADEQUATE

## 2019-06-24 ENCOUNTER — Other Ambulatory Visit: Payer: Self-pay

## 2019-06-24 DIAGNOSIS — E039 Hypothyroidism, unspecified: Secondary | ICD-10-CM

## 2019-06-24 MED ORDER — LEVOTHYROXINE SODIUM 137 MCG PO TABS: 137.0000 ug | ORAL_TABLET | Freq: Every day | ORAL | 0 refills | Status: DC

## 2019-06-29 ENCOUNTER — Telehealth: Payer: Self-pay

## 2019-06-29 DIAGNOSIS — E039 Hypothyroidism, unspecified: Secondary | ICD-10-CM

## 2019-06-29 NOTE — Telephone Encounter (Signed)
Patient has been on brand name Synthroid medication for years since he was seen by Dr. Council Mechanic. Patient states he never tried generic Synthroid and that Dr Council Mechanic when he started patient on this told him he did not want him to take generic form and that brand name worked better. Patient states he is willing to try generic form if needed. I advised patient that we are waiting for PA response from insurance and will go from there. FYI to Dr Damita Dunnings in the meantime. PA submitted through covermymeds today. PA case ID # [id number]

## 2019-06-29 NOTE — Telephone Encounter (Signed)
Usually we try to keep patients on the same brand or formulation of this medication.  If the patient needs to make a change from brand name to generic, then I am usually okay with that if the patient then continues on the same version of the generic medication from that point onward.  We can usually recheck a TSH 2 months after the change just to make sure they are appropriately dosed on the new medication.  I will await the PA in the meantime.  Thanks.

## 2019-07-01 NOTE — Telephone Encounter (Signed)
So far still no answer from insurance on PA, can take up to 72 hours

## 2019-07-04 NOTE — Addendum Note (Signed)
Addended by: Tonia Ghent on: 07/04/2019 10:45 AM   Modules accepted: Orders

## 2019-07-04 NOTE — Telephone Encounter (Signed)
Notify pt.  If he is okay with the change to generic, then proceed.  I would recheck TSH 2 months after the change.  I put in the order for the f/u lab.  Thanks.

## 2019-07-04 NOTE — Telephone Encounter (Signed)
PA denied due to patient has not tried generic thyroid medication. Placing denial letter from insurance in Dr Josefine Class inbox. Patient is not aware yet.

## 2019-07-05 MED ORDER — LEVOTHYROXINE SODIUM 137 MCG PO TABS: 137.0000 ug | ORAL_TABLET | Freq: Every day | ORAL | 0 refills | Status: DC

## 2019-07-05 NOTE — Addendum Note (Signed)
Addended by: Josetta Huddle on: 07/05/2019 11:26 AM   Modules accepted: Orders

## 2019-07-05 NOTE — Telephone Encounter (Signed)
Patient is ok with generic, and already has an upcoming lab appt scheduled for a physical in December.  Please add TSH to that order.  Levothyroxine sent to pharmacy.

## 2019-08-17 ENCOUNTER — Other Ambulatory Visit: Payer: Self-pay | Admitting: Family Medicine

## 2019-08-17 DIAGNOSIS — E039 Hypothyroidism, unspecified: Secondary | ICD-10-CM

## 2019-08-17 DIAGNOSIS — E785 Hyperlipidemia, unspecified: Secondary | ICD-10-CM

## 2019-08-24 ENCOUNTER — Telehealth: Payer: Self-pay

## 2019-08-24 NOTE — Telephone Encounter (Signed)
LVM to call clinic, needs COVID screen, front door and back lab info 12.9.2020 TLJ  

## 2019-08-26 ENCOUNTER — Other Ambulatory Visit: Payer: Self-pay

## 2019-08-26 ENCOUNTER — Other Ambulatory Visit (INDEPENDENT_AMBULATORY_CARE_PROVIDER_SITE_OTHER): Payer: 59

## 2019-08-26 DIAGNOSIS — E785 Hyperlipidemia, unspecified: Secondary | ICD-10-CM | POA: Diagnosis not present

## 2019-08-26 DIAGNOSIS — E039 Hypothyroidism, unspecified: Secondary | ICD-10-CM | POA: Diagnosis not present

## 2019-08-26 LAB — LIPID PANEL
Cholesterol: 217 mg/dL — ABNORMAL HIGH (ref 0–200)
HDL: 59.4 mg/dL (ref >39.00)
LDL Cholesterol: 144 mg/dL — ABNORMAL HIGH (ref 0–99)
NonHDL: 157.65
Total CHOL/HDL Ratio: 4
Triglycerides: 66 mg/dL (ref 0.0–149.0)
VLDL: 13.2 mg/dL (ref 0.0–40.0)

## 2019-08-26 LAB — TSH: TSH: 5.39 u[IU]/mL — ABNORMAL HIGH (ref 0.35–4.50)

## 2019-08-26 LAB — COMPREHENSIVE METABOLIC PANEL
ALT: 22 U/L (ref 0–53)
AST: 19 U/L (ref 0–37)
Albumin: 4.3 g/dL (ref 3.5–5.2)
Alkaline Phosphatase: 50 U/L (ref 39–117)
BUN: 20 mg/dL (ref 6–23)
CO2: 29 mEq/L (ref 19–32)
Calcium: 8.9 mg/dL (ref 8.4–10.5)
Chloride: 102 mEq/L (ref 96–112)
Creatinine, Ser: 1.16 mg/dL (ref 0.40–1.50)
GFR: 66.41 mL/min (ref >60.00)
Glucose, Bld: 92 mg/dL (ref 70–99)
Potassium: 4.5 mEq/L (ref 3.5–5.1)
Sodium: 136 mEq/L (ref 135–145)
Total Bilirubin: 0.9 mg/dL (ref 0.2–1.2)
Total Protein: 6.8 g/dL (ref 6.0–8.3)

## 2019-09-02 ENCOUNTER — Encounter: Payer: Self-pay | Admitting: Family Medicine

## 2019-09-02 ENCOUNTER — Other Ambulatory Visit: Payer: Self-pay

## 2019-09-02 ENCOUNTER — Ambulatory Visit (INDEPENDENT_AMBULATORY_CARE_PROVIDER_SITE_OTHER): Payer: 59 | Admitting: Family Medicine

## 2019-09-02 ENCOUNTER — Ambulatory Visit (INDEPENDENT_AMBULATORY_CARE_PROVIDER_SITE_OTHER)
Admission: RE | Admit: 2019-09-02 | Discharge: 2019-09-02 | Disposition: A | Discharge status: Home / Self Care | Payer: 59 | Source: Ambulatory Visit | Attending: Family Medicine | Admitting: Family Medicine

## 2019-09-02 ENCOUNTER — Telehealth: Payer: Self-pay

## 2019-09-02 VITALS — BP 126/84 | HR 65 | Temp 97.5°F | Ht 69.0 in | Wt 184.0 lb

## 2019-09-02 DIAGNOSIS — M79641 Pain in right hand: Secondary | ICD-10-CM

## 2019-09-02 DIAGNOSIS — E785 Hyperlipidemia, unspecified: Secondary | ICD-10-CM

## 2019-09-02 DIAGNOSIS — Z0000 Encounter for general adult medical examination without abnormal findings: Secondary | ICD-10-CM

## 2019-09-02 DIAGNOSIS — Z23 Encounter for immunization: Secondary | ICD-10-CM

## 2019-09-02 DIAGNOSIS — Z7189 Other specified counseling: Secondary | ICD-10-CM

## 2019-09-02 DIAGNOSIS — M62838 Other muscle spasm: Secondary | ICD-10-CM

## 2019-09-02 DIAGNOSIS — Z1211 Encounter for screening for malignant neoplasm of colon: Secondary | ICD-10-CM

## 2019-09-02 DIAGNOSIS — E039 Hypothyroidism, unspecified: Secondary | ICD-10-CM

## 2019-09-02 DIAGNOSIS — N410 Acute prostatitis: Secondary | ICD-10-CM

## 2019-09-02 MED ORDER — BACLOFEN 10 MG PO TABS: ORAL_TABLET | ORAL | 1 refills | Status: DC

## 2019-09-02 MED ORDER — LEVOTHYROXINE SODIUM 150 MCG PO TABS: 150.0000 ug | ORAL_TABLET | Freq: Every day | ORAL | 3 refills | Status: DC

## 2019-09-02 MED ORDER — EPINEPHRINE 0.3 MG/0.3ML IJ SOAJ: 0.3000 mg | Freq: Once | INTRAMUSCULAR | 0 refills | Status: AC

## 2019-09-02 NOTE — Telephone Encounter (Signed)
Pharmacist at [facility] called to verify that pts levothyroxine was changed from 122mcg to 150 mcg. I advised pharmacist that the med list does have levothyroxine 150 mcg taking one tablet daily and the 09/02/19 office note has increase the dose of thyroid med and reck labs in 2 months. 150 mcg is the next dose if increased. Pharmacist voiced understanding and just wanted to verify,. Nothing further needed. Will send to Dr Damita Dunnings confirmation this is correct.

## 2019-09-02 NOTE — Patient Instructions (Addendum)
Go to the lab on the way out.  We'll contact you with your xray report. Increase the dose of thyroid medicine and recheck labs in about 2 months.  Use the splint as needed and update me as needed.  Take care.  Glad to see you.  Thanks for your effort.

## 2019-09-02 NOTE — Telephone Encounter (Signed)
The [facility] pharmacist felt comfortable in filling when we talked earlier and I just wanted confirmation so H/T did fill the levothyroxine 150 mcg already.nothing further needed. Thank you.

## 2019-09-02 NOTE — Progress Notes (Signed)
This visit occurred during the SARS-CoV-2 public health emergency.  Safety protocols were in place, including screening questions prior to the visit, additional usage of staff PPE, and extensive cleaning of exam room while observing appropriate contact time as indicated for disinfecting solutions.  CPE- See plan.  Routine anticipatory guidance given to patient.  See health maintenance.  The possibility exists that previously documented standard health maintenance information may have been brought forward from a previous encounter into this note.  If needed, that same information has been updated to reflect the current situation based on today's encounter.    Tetanus 2017.  Flu shot today.  Diet and exercise discussed.  Living will d/w pt. Encouraged. Wife designated if he is incapacitated.  D/w patient [id number] for colon cancer screening, including IFOB vs. colonoscopy.  Risks and benefits of both were discussed and patient voiced understanding.  cologuard.  Defer PSA at this point given prev prostatitis.  I didn't want to get a false positive, d/w pt.    He is working in [place] but commuting back and forth.  He is tolerating the commute.  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    Prev prostatitis resolved after abx.  No residual sx in the meantime.  We talked about not checking a PSA because I did not want to get a false elevation.  He quit dipping four years ago.  D/w pt.    Hypothyroidism.  Compliant.  No dysphagia, no neck mass, no ADE on med.  Labs d/w pt.     Prev GERD/indigestion resolved with intentional weight loss  R 4th MCP ttp for 1 month after his hand twisted when a drill caught on rebar (the bit stopped moving but then the drill rotated in his hand).   Normal flex and ext but he has had soreness on range of motion.  PMH and SH reviewed  Meds, vitals, and allergies reviewed.   ROS: Per HPI.  Unless specifically indicated otherwise in HPI,  the patient denies:  General: fever. Eyes: acute vision changes ENT: sore throat Cardiovascular: chest pain Respiratory: SOB GI: vomiting GU: dysuria Musculoskeletal: acute back pain Derm: acute rash Neuro: acute motor dysfunction Psych: worsening mood Endocrine: polydipsia Heme: bleeding Allergy: hayfever  GEN: nad, alert and oriented HEENT: ncat NECK: supple w/o LA, no tmg CV: rrr. PULM: ctab, no inc wob ABD: soft, +bs EXT: no edema SKIN: no acute rash Right hand with normal flexion and extension of all the digits.  No sign of tendon failure.  He does have discomfort at the right fourth MCP with flexion and extension without bruising or swelling.  The 10-year ASCVD risk score Mikey Bussing DC Brooke Bonito., et al., 2013) is: 3.2%   Values used to calculate the score:     Age: 48 years     Sex: Male     Is Non-Hispanic African American: No     Diabetic: No     Tobacco smoker: No     Systolic Blood Pressure: 998 mmHg     Is BP treated: No     HDL Cholesterol: 59.4 mg/dL     Total Cholesterol: 217 mg/dL

## 2019-09-02 NOTE — Telephone Encounter (Signed)
Yes, change to 136mcg daily.  Thanks.

## 2019-09-04 DIAGNOSIS — M79641 Pain in right hand: Secondary | ICD-10-CM | POA: Insufficient documentation

## 2019-09-04 NOTE — Assessment & Plan Note (Signed)
See notes on imaging. He'll try a spline and update me as needed.  He felt better in the splint.  He may be able to use that over the next few days and if his pain improves he can gradually wean out of it.

## 2019-09-04 NOTE — Assessment & Plan Note (Signed)
Tetanus 2017.  Flu shot today.  Diet and exercise discussed.  Living will d/w pt. Encouraged. Wife designated if he is incapacitated.  D/w patient [id number] for colon cancer screening, including IFOB vs. colonoscopy.  Risks and benefits of both were discussed and patient voiced understanding.  cologuard.  Defer PSA at this point given prev prostatitis.  I didn't want to get a false positive, d/w pt.

## 2019-09-04 NOTE — Assessment & Plan Note (Signed)
He can hold prescription for baclofen and use in the future if needed.  Discussed.  He agrees.

## 2019-09-04 NOTE — Assessment & Plan Note (Signed)
Resolved

## 2019-09-04 NOTE — Assessment & Plan Note (Signed)
The 10-year ASCVD risk score Mikey Bussing DC Brooke Bonito., et al., 2013) is: 3.2%   Values used to calculate the score:     Age: 48 years     Sex: Male     Is Non-Hispanic African American: No     Diabetic: No     Tobacco smoker: No     Systolic Blood Pressure: 161 mmHg     Is BP treated: No     HDL Cholesterol: 59.4 mg/dL     Total Cholesterol: 217 mg/dL  ASCVD acceptably low.  Continue work on diet and exercise.

## 2019-09-04 NOTE — Assessment & Plan Note (Signed)
Compliant.  No dysphagia, no neck mass, no ADE on med.  Labs d/w pt.    would increase to 150 mcg daily.  Recheck TSH in about 2 months.  He agrees.

## 2019-09-04 NOTE — Assessment & Plan Note (Signed)
Living will d/w pt. Encouraged. Wife designated if he is incapacitated.

## 2019-11-23 ENCOUNTER — Telehealth: Payer: Self-pay

## 2019-11-23 NOTE — Telephone Encounter (Signed)
[facility] Night - Client Nonclinical Telephone Record AccessNurse Client [facility] Night - Client Client Site [facility] - Night Physician Raechel Ache - MD Contact Type Call Who Is Calling Patient / Member / Family / Caregiver Caller Name Jaxsyn Azam Caller Phone Number [phone] Patient Name Jose Osborne Patient DOB [birth year] Call Type Message Only Information Provided Reason for Call Request for General Office Information Initial Comment Caller states he received a call today but no message was left, he thinks it may be about his Labs. Caller tested Positive COVID and still in quarantine. Additional Comment Disp. Time Disposition Final User 11/22/2019 5:35:28 PM General Information Provided Yes Tiburcio Pea, Lanette Call Closed By: Evette Doffing Transaction Date/Time: 11/22/2019 5:32:30 PM (ET)

## 2020-04-11 IMAGING — DX DG HAND COMPLETE 3+V*R*
3 series · 3 of 3 positions shown · non-contrast
Comparison: None.

CLINICAL DATA: Right 4th MCP pain after drill injury.

EXAM:
RIGHT HAND - COMPLETE 3+ VIEW

[hand ap]
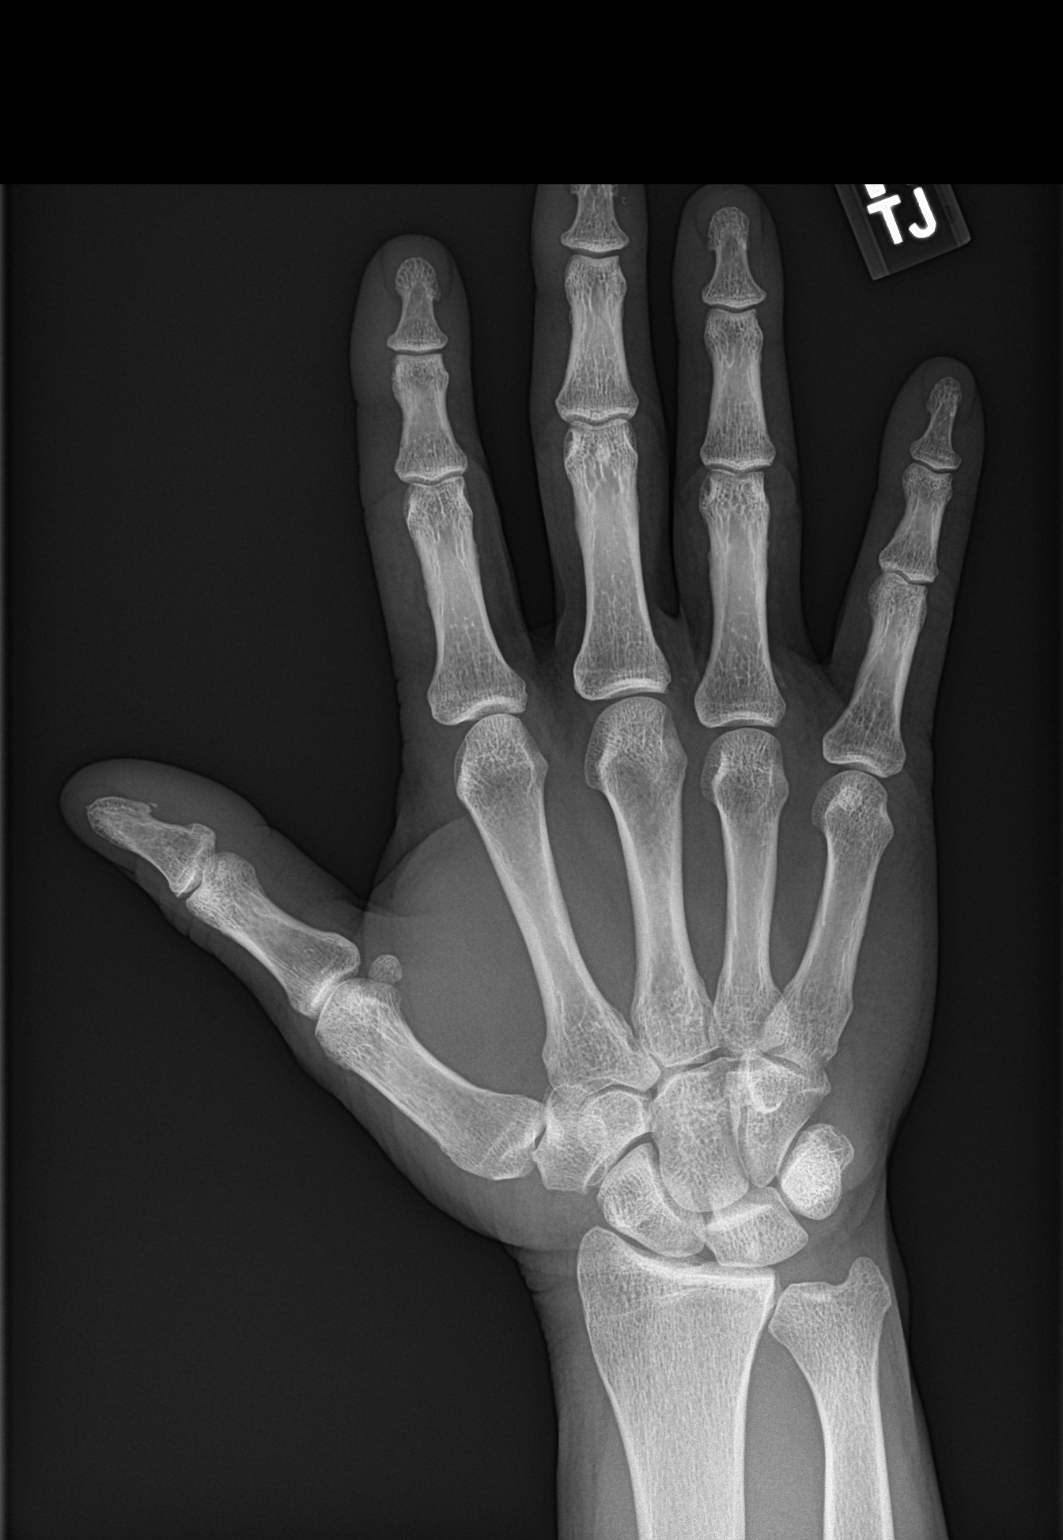

[hand obl]
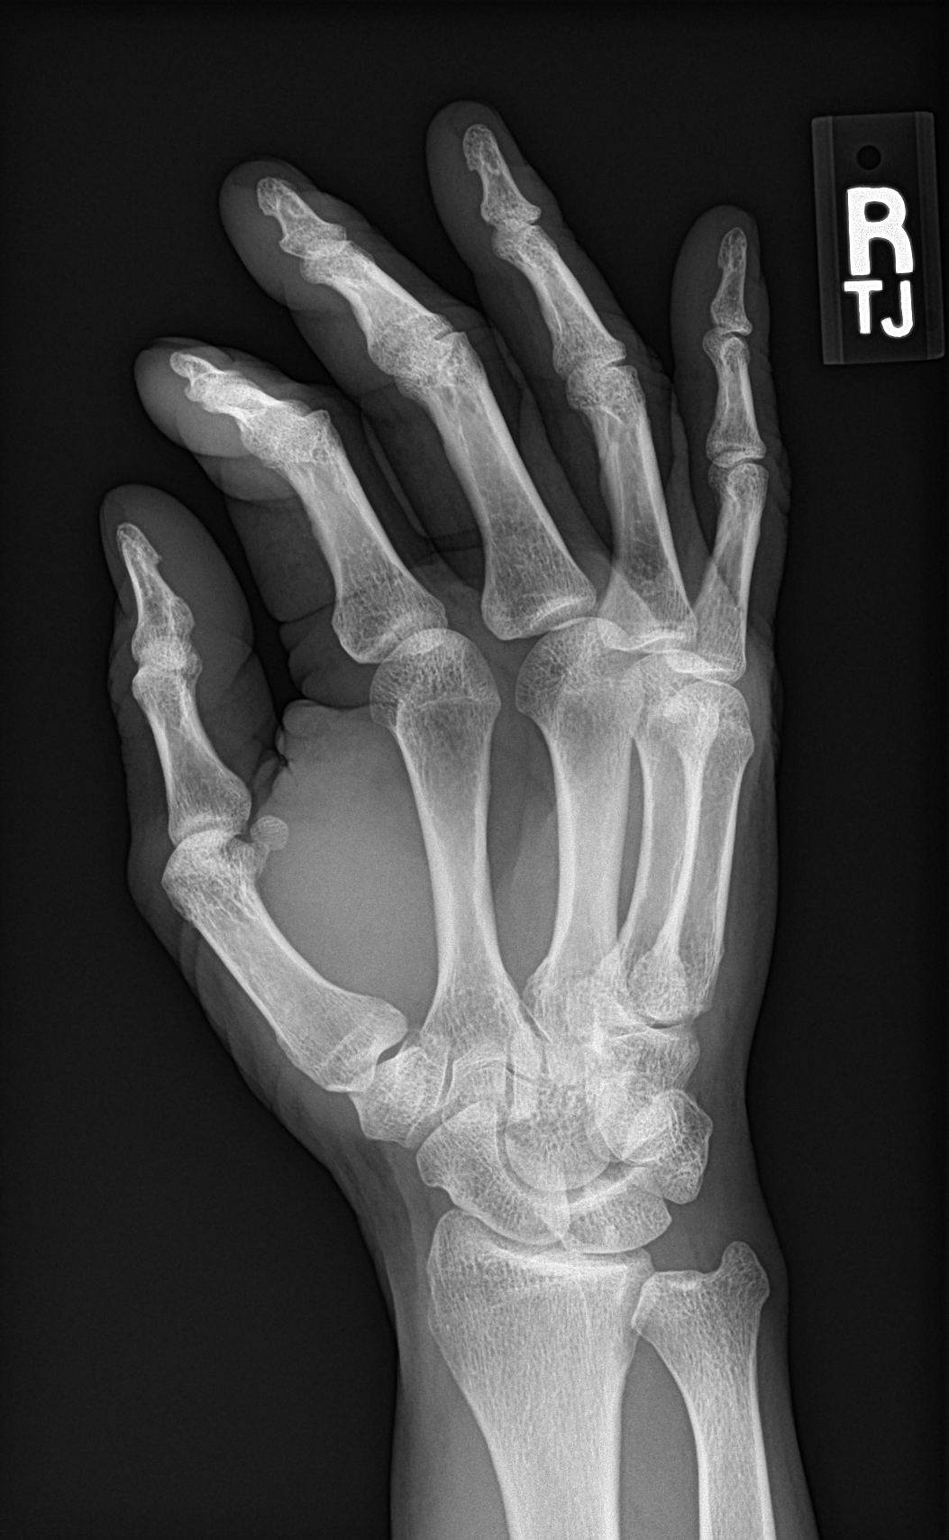

[hand lat]
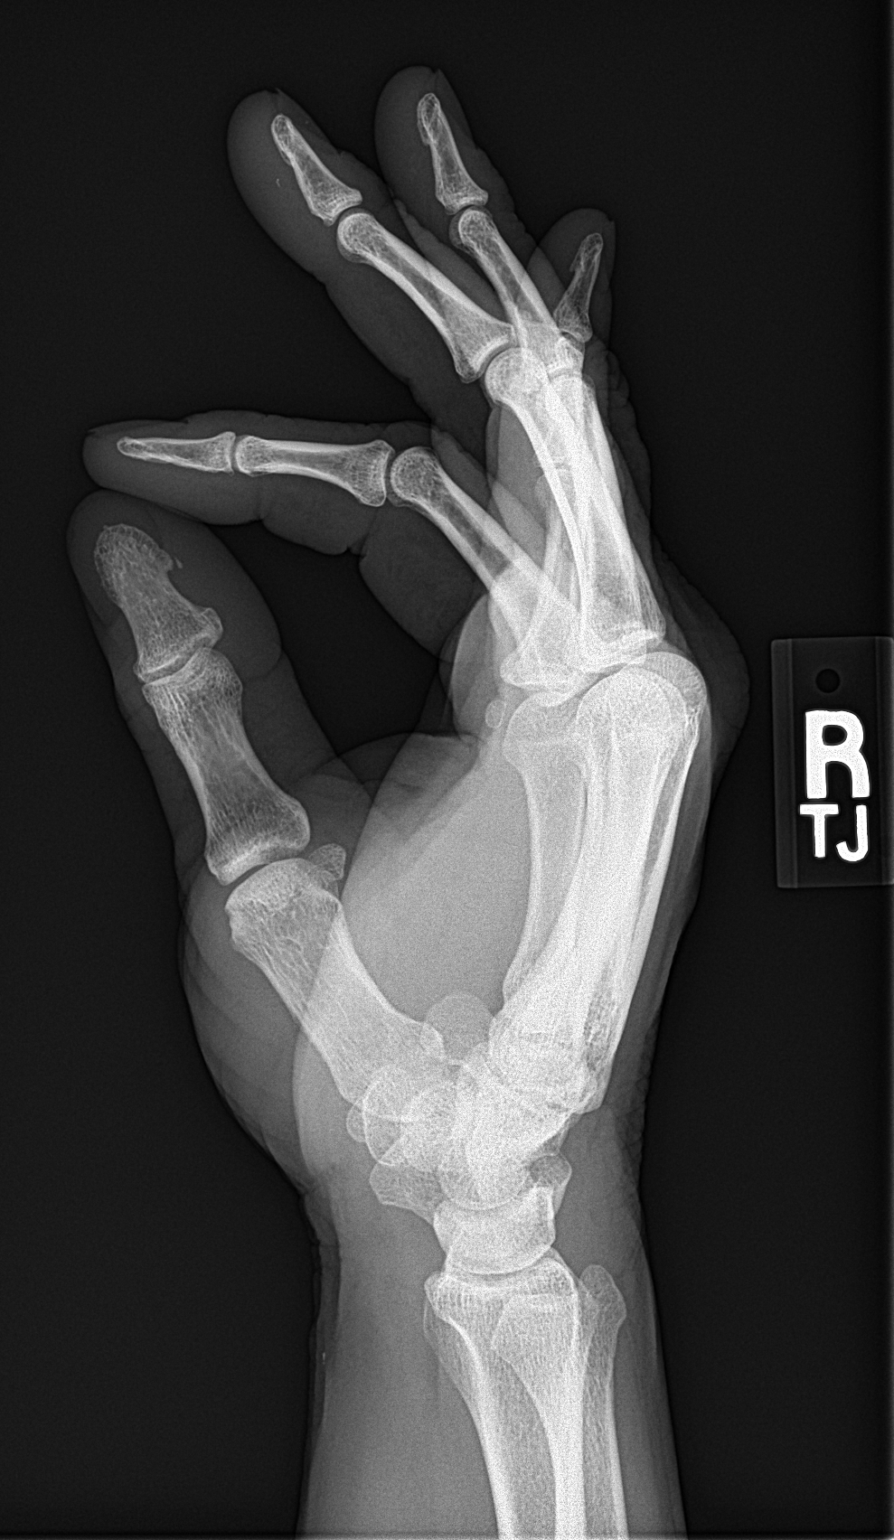

[3 of 3 positions shown; findings below may reference images not displayed]

FINDINGS: Soft tissue swelling over the MCP joint posteriorly. No acute bony
abnormality. Specifically, no fracture, subluxation, or dislocation.
Joint spaces maintained. No radiopaque foreign body.
IMPRESSION: No bony abnormality.

## 2020-08-13 LAB — COLOGUARD: Cologuard: NEGATIVE

## 2020-08-21 LAB — COLOGUARD: COLOGUARD: NEGATIVE

## 2020-08-24 ENCOUNTER — Encounter: Payer: Self-pay | Admitting: Family Medicine

## 2020-08-25 ENCOUNTER — Other Ambulatory Visit: Payer: Self-pay | Admitting: Family Medicine

## 2020-08-25 DIAGNOSIS — E039 Hypothyroidism, unspecified: Secondary | ICD-10-CM

## 2020-08-28 ENCOUNTER — Other Ambulatory Visit: Payer: Self-pay | Admitting: Family Medicine

## 2020-08-28 DIAGNOSIS — E039 Hypothyroidism, unspecified: Secondary | ICD-10-CM

## 2020-08-28 NOTE — Telephone Encounter (Signed)
Pharmacy requests refill on: Levothyroxine 150 mcg   LAST REFILL: 09/02/2019 LAST OV: 09/02/2019 NEXT OV: Not Scheduled  PHARMACY: [facility], [place] TSH (08/26/2019): 5.39  Please schedule CPE for further refills.

## 2020-08-29 NOTE — Telephone Encounter (Signed)
Sent. Thanks.   

## 2020-09-21 ENCOUNTER — Encounter: Payer: 59 | Admitting: Family Medicine

## 2020-11-24 ENCOUNTER — Other Ambulatory Visit: Payer: Self-pay | Admitting: Family Medicine

## 2020-11-24 DIAGNOSIS — E039 Hypothyroidism, unspecified: Secondary | ICD-10-CM

## 2020-11-26 NOTE — Telephone Encounter (Signed)
Patient has not been seen since 2020; please call and schedule appt and send back to me so I can send to Dr. Para March for refill.

## 2020-11-27 NOTE — Telephone Encounter (Signed)
Last office visit 09/02/19 Last refill 08/29/20 #90 Upcoming appointment 12/03/20

## 2020-11-27 NOTE — Telephone Encounter (Signed)
Pt is scheduled 3/21

## 2020-11-28 NOTE — Telephone Encounter (Signed)
Sent. Thanks.   

## 2020-11-30 ENCOUNTER — Other Ambulatory Visit (INDEPENDENT_AMBULATORY_CARE_PROVIDER_SITE_OTHER): Payer: 59

## 2020-11-30 ENCOUNTER — Other Ambulatory Visit: Payer: Self-pay | Admitting: Family Medicine

## 2020-11-30 ENCOUNTER — Other Ambulatory Visit: Payer: Self-pay

## 2020-11-30 DIAGNOSIS — E039 Hypothyroidism, unspecified: Secondary | ICD-10-CM

## 2020-11-30 DIAGNOSIS — E785 Hyperlipidemia, unspecified: Secondary | ICD-10-CM

## 2020-11-30 LAB — LIPID PANEL
Cholesterol: 225 mg/dL — ABNORMAL HIGH (ref 0–200)
HDL: 61.1 mg/dL (ref >39.00)
LDL Cholesterol: 155 mg/dL — ABNORMAL HIGH (ref 0–99)
NonHDL: 164.05
Total CHOL/HDL Ratio: 4
Triglycerides: 44 mg/dL (ref 0.0–149.0)
VLDL: 8.8 mg/dL (ref 0.0–40.0)

## 2020-11-30 LAB — COMPREHENSIVE METABOLIC PANEL
ALT: 26 U/L (ref 0–53)
AST: 22 U/L (ref 0–37)
Albumin: 4.3 g/dL (ref 3.5–5.2)
Alkaline Phosphatase: 55 U/L (ref 39–117)
BUN: 12 mg/dL (ref 6–23)
CO2: 28 mEq/L (ref 19–32)
Calcium: 9 mg/dL (ref 8.4–10.5)
Chloride: 104 mEq/L (ref 96–112)
Creatinine, Ser: 1.14 mg/dL (ref 0.40–1.50)
GFR: 74.14 mL/min (ref >60.00)
Glucose, Bld: 92 mg/dL (ref 70–99)
Potassium: 4.6 mEq/L (ref 3.5–5.1)
Sodium: 139 mEq/L (ref 135–145)
Total Bilirubin: 0.7 mg/dL (ref 0.2–1.2)
Total Protein: 6.9 g/dL (ref 6.0–8.3)

## 2020-11-30 LAB — TSH: TSH: 6.27 u[IU]/mL — ABNORMAL HIGH (ref 0.35–4.50)

## 2020-12-03 ENCOUNTER — Other Ambulatory Visit: Payer: Self-pay

## 2020-12-03 ENCOUNTER — Ambulatory Visit (INDEPENDENT_AMBULATORY_CARE_PROVIDER_SITE_OTHER): Payer: 59 | Admitting: Family Medicine

## 2020-12-03 ENCOUNTER — Encounter: Payer: Self-pay | Admitting: Family Medicine

## 2020-12-03 VITALS — BP 138/86 | HR 72 | Temp 98.0°F | Ht 69.0 in | Wt 185.0 lb

## 2020-12-03 DIAGNOSIS — Z1159 Encounter for screening for other viral diseases: Secondary | ICD-10-CM

## 2020-12-03 DIAGNOSIS — E039 Hypothyroidism, unspecified: Secondary | ICD-10-CM

## 2020-12-03 DIAGNOSIS — Z0000 Encounter for general adult medical examination without abnormal findings: Secondary | ICD-10-CM

## 2020-12-03 DIAGNOSIS — Z114 Encounter for screening for human immunodeficiency virus [HIV]: Secondary | ICD-10-CM

## 2020-12-03 DIAGNOSIS — Z7189 Other specified counseling: Secondary | ICD-10-CM

## 2020-12-03 DIAGNOSIS — E785 Hyperlipidemia, unspecified: Secondary | ICD-10-CM

## 2020-12-03 MED ORDER — LEVOTHYROXINE SODIUM 175 MCG PO TABS: 175.0000 ug | ORAL_TABLET | Freq: Every day | ORAL | 3 refills | Status: DC

## 2020-12-03 NOTE — Patient Instructions (Addendum)
Check with your insurance to see if they will cover the shingrix shot.  Higher dose of thyroid medicine.   Recheck at a nonfasting lab appointment about 2 months after the med change.    Take care.  Glad to see you. Update me as needed.

## 2020-12-03 NOTE — Progress Notes (Signed)
This visit occurred during the SARS-CoV-2 public health emergency.  Safety protocols were in place, including screening questions prior to the visit, additional usage of staff PPE, and extensive cleaning of exam room while observing appropriate contact time as indicated for disinfecting solutions.  CPE- See plan.  Routine anticipatory guidance given to patient.  See health maintenance.  The possibility exists that previously documented standard health maintenance information may have been brought forward from a previous encounter into this note.  If needed, that same information has been updated to reflect the current situation based on today's encounter.    Tetanus 2017.  Flu shot d/w pt.   covid vaccine 2021. PNA not due.  shingles d/w pt.   Diet and exercise discussed. Still getting 18-20K steps per day.   Living will d/w pt. Wife designated if he is incapacitated.  cologuard neg 2021 Defer PSA at this point given prev prostatitis.  Goal to avoid false positive, d/w pt.  no urinary sx.   Pt opts in for HCV screening.  D/w pt re: routine screening.   Pt opts in for HIV screening.  D/w pt re: routine screening.    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  Discussed.  Hypothyroidism.  Compliant.  TSH slightly elevated.  No neck mass.  No dysphagia.  Elevated Cholesterol: No CP SOB BLE edema.  ASCVD score discussed below.  PMH and SH reviewed  Meds, vitals, and allergies reviewed.   ROS: Per HPI.  Unless specifically indicated otherwise in HPI, the patient denies:  General: fever. Eyes: acute vision changes ENT: sore throat Cardiovascular: chest pain Respiratory: SOB GI: vomiting GU: dysuria Musculoskeletal: acute back pain Derm: acute rash Neuro: acute motor dysfunction Psych: worsening mood Endocrine: polydipsia Heme: bleeding Allergy: hayfever  GEN: nad, alert and oriented HEENT: ncat NECK: supple w/o LA CV: rrr. PULM: ctab, no inc wob ABD: soft,  +bs EXT: no edema SKIN: no acute rash  The 10-year ASCVD risk score Denman George DC Jr., et al., 2013) is: 4.6%   Values used to calculate the score:     Age: 51 years     Sex: Male     Is Non-Hispanic African American: No     Diabetic: No     Tobacco smoker: No     Systolic Blood Pressure: 138 mmHg     Is BP treated: No     HDL Cholesterol: 61.1 mg/dL     Total Cholesterol: 225 mg/dL

## 2020-12-05 NOTE — Assessment & Plan Note (Signed)
Living will d/w pt.  Wife designated if he is incapacitated.  

## 2020-12-05 NOTE — Assessment & Plan Note (Signed)
We can keep an eye on his lipids with yearly recheck.  Continue work on diet and exercise.  Discussed ASCVD score.  At this point would not yet start a statin.  He agrees to plan.

## 2020-12-05 NOTE — Assessment & Plan Note (Signed)
Tetanus 2017.  Flu shot d/w pt.   covid vaccine 2021. PNA not due.  shingles d/w pt.   Diet and exercise discussed. Still getting 18-20K steps per day.   Living will d/w pt.  Wife designated if he is incapacitated.  cologuard neg 2021 Defer PSA at this point given prev prostatitis.  Goal to avoid false positive, d/w pt.  no urinary sx.  

## 2020-12-05 NOTE — Assessment & Plan Note (Signed)
TSH slightly elevated.  No neck mass.  No dysphagia.  Increase levothyroxine to 175 mcg a day and recheck labs in a few months.  He agrees.

## 2021-02-22 ENCOUNTER — Other Ambulatory Visit: Payer: Self-pay

## 2021-02-22 ENCOUNTER — Other Ambulatory Visit (INDEPENDENT_AMBULATORY_CARE_PROVIDER_SITE_OTHER): Payer: 59

## 2021-02-22 DIAGNOSIS — Z1159 Encounter for screening for other viral diseases: Secondary | ICD-10-CM | POA: Diagnosis not present

## 2021-02-22 DIAGNOSIS — E039 Hypothyroidism, unspecified: Secondary | ICD-10-CM | POA: Diagnosis not present

## 2021-02-22 DIAGNOSIS — Z114 Encounter for screening for human immunodeficiency virus [HIV]: Secondary | ICD-10-CM

## 2021-02-22 LAB — TSH: TSH: 0.61 u[IU]/mL (ref 0.35–4.50)

## 2021-02-25 LAB — HEPATITIS C ANTIBODY
Hepatitis C Ab: NONREACTIVE
SIGNAL TO CUT-OFF: 0.01 (ref <1.00)

## 2021-02-25 LAB — HIV ANTIBODY (ROUTINE TESTING W REFLEX): HIV 1&2 Ab, 4th Generation: NONREACTIVE

## 2021-12-07 ENCOUNTER — Other Ambulatory Visit: Payer: Self-pay | Admitting: Family Medicine

## 2021-12-07 DIAGNOSIS — E039 Hypothyroidism, unspecified: Secondary | ICD-10-CM

## 2021-12-09 NOTE — Telephone Encounter (Signed)
Refill request for levothyroxine 175 mcg tabs ? ?LOV - 12/03/20 ?Next OV - not scheduled ?Last refill - 12/03/20 #90/3 ? ?

## 2021-12-09 NOTE — Telephone Encounter (Signed)
Patient overdue for appt; please schedule.  ?

## 2021-12-11 NOTE — Telephone Encounter (Signed)
Sent. Please schedule cpe.  Thanks.  ?

## 2021-12-12 ENCOUNTER — Telehealth: Payer: Self-pay | Admitting: Family Medicine

## 2021-12-12 NOTE — Telephone Encounter (Signed)
I called pt to schedule cpe/lab and pt stated that he can't schedule one at this time  and he not sure when he will be able schedule one so I asked pt when he is ready to give [place] a call back ?

## 2021-12-15 NOTE — Telephone Encounter (Signed)
Noted. Thanks.

## 2022-03-12 ENCOUNTER — Encounter: Payer: Self-pay | Admitting: Family

## 2022-03-12 ENCOUNTER — Ambulatory Visit (INDEPENDENT_AMBULATORY_CARE_PROVIDER_SITE_OTHER)
Admission: RE | Admit: 2022-03-12 | Discharge: 2022-03-12 | Disposition: A | Discharge status: Home / Self Care | Payer: 59 | Source: Ambulatory Visit | Attending: Family | Admitting: Family

## 2022-03-12 ENCOUNTER — Ambulatory Visit: Payer: 59 | Admitting: Family

## 2022-03-12 VITALS — BP 136/90 | HR 58 | Temp 98.7°F | Resp 16 | Ht 69.0 in | Wt 198.4 lb

## 2022-03-12 DIAGNOSIS — M25521 Pain in right elbow: Secondary | ICD-10-CM | POA: Insufficient documentation

## 2022-03-12 MED ORDER — IBUPROFEN 600 MG PO TABS: 600.0000 mg | ORAL_TABLET | Freq: Three times a day (TID) | ORAL | 0 refills | Status: AC | PRN

## 2022-03-12 NOTE — Assessment & Plan Note (Signed)
Xray today pending results Recommend elbow compression sleeve  Ice/heat Exercises printed out Rest as able  Referral orthopedist, may need injection

## 2022-03-12 NOTE — Progress Notes (Signed)
Established Patient Office Visit  Subjective:  Patient ID: Jose Osborne, male    DOB: 06-08-1971  Age: 51 y.o. MRN: [id number]  CC:  Chief Complaint  Patient presents with   Elbow Injury    Right pain and swelling X 2 months    HPI Jose Osborne is here today with concerns.   Over the last two months has noticed that he has some right elbow pain that is getting worse. He feels like it is tender to the touch and increased swelling. When he is riding down the road and puts his arm on the arm rest he feels the elbow 'pop' and then tingles.   Tried some advil with mild relief yesterday but other than that no improvement.   Past Medical History:  Diagnosis Date   Allergy    GERD (gastroesophageal reflux disease)    Hyperlipidemia    Hypothyroidism     Past Surgical History:  Procedure Laterality Date   HAND SURGERY      Family History  Adopted: Yes  Problem Relation Age of Onset   Alcohol abuse Mother    Thyroid disease Sister    Hypertension Brother    Heart disease Neg Hx    Colon cancer Neg Hx    Prostate cancer Neg Hx     Social History   Socioeconomic History   Marital status: Married    Spouse name: Not on file   Number of children: 0   Years of education: Not on file   Highest education level: Not on file  Occupational History   Occupation: Musician: [employer]  Tobacco Use   Smoking status: Never   Smokeless tobacco: Former    Types: Snuff   Tobacco comments:    quit 09/16/15  Substance and Sexual Activity   Alcohol use: Yes    Alcohol/week: 0.0 standard drinks of alcohol    Comment: beer, occ   Drug use: No   Sexual activity: Not on file  Other Topics Concern   Not on file  Social History Narrative   His biological mother gave him up for adoption and he was adopted by her sister.   Married 2002   No kids   Working triad Therapist, sports.    Social Determinants of Corporate investment banker Strain: Not on file   Food Insecurity: Not on file  Transportation Needs: Not on file  Physical Activity: Not on file  Stress: Not on file  Social Connections: Not on file  Intimate Partner Violence: Not on file    Outpatient Medications Prior to Visit  Medication Sig Dispense Refill   baclofen (LIORESAL) 10 MG tablet TAKE 1/2 TO 1 TABLET BY MOUTH THREE TIMES A DAY AS NEEDED FOR MUSCLE SPASMS.  Sedation caution. 30 tablet 1   levothyroxine (SYNTHROID) 175 MCG tablet TAKE ONE TABLET BY MOUTH DAILY 90 tablet 1   No facility-administered medications prior to visit.    Allergies  Allergen Reactions   Ace Inhibitors Swelling    H/o lip swelling- not with med use.  Would still avoid this med.    Angiotensin Receptor Blockers Swelling    H/o lip swelling- not with med use.  Would still avoid this med.         Objective:    Physical Exam Constitutional:      General: He is not in acute distress.    Appearance: Normal appearance. He is normal weight. He  is not ill-appearing, toxic-appearing or diaphoretic.  Pulmonary:     Effort: Pulmonary effort is normal.  Musculoskeletal:     Right elbow: Swelling and effusion present. Normal range of motion. Tenderness (right lateral epicondyle) present in lateral epicondyle.  Neurological:     Mental Status: He is alert.     BP 136/90   Pulse (!) 58   Temp 98.7 F (37.1 C)   Resp 16   Ht 5\' 9"  (1.753 m)   Wt 198 lb 6 oz (90 kg)   SpO2 97%   BMI 29.29 kg/m  Wt Readings from Last 3 Encounters:  03/12/22 198 lb 6 oz (90 kg)  12/03/20 185 lb (83.9 kg)  09/02/19 184 lb (83.5 kg)     Health Maintenance Due  Topic Date Due   Zoster Vaccines- Shingrix (1 of 2) Never done   COVID-19 Vaccine (3 - Pfizer series) 05/26/2020    There are no preventive care reminders to display for this patient.  Lab Results  Component Value Date   TSH 0.61 02/22/2021   Lab Results  Component Value Date   WBC 5.6 08/24/2009   HGB 15.3 08/24/2009   HCT 44.3  08/24/2009   MCV 90.1 08/24/2009   PLT 213.0 08/24/2009   Lab Results  Component Value Date   NA 139 11/30/2020   K 4.6 11/30/2020   CO2 28 11/30/2020   GLUCOSE 92 11/30/2020   BUN 12 11/30/2020   CREATININE 1.14 11/30/2020   BILITOT 0.7 11/30/2020   ALKPHOS 55 11/30/2020   AST 22 11/30/2020   ALT 26 11/30/2020   PROT 6.9 11/30/2020   ALBUMIN 4.3 11/30/2020   CALCIUM 9.0 11/30/2020   GFR 74.14 11/30/2020   No results found for: "HGBA1C"    Assessment & Plan:   Problem List Items Addressed This Visit       Other   Right elbow pain - Primary    Xray today pending results Recommend elbow compression sleeve  Ice/heat Exercises printed out Rest as able  Referral orthopedist, may need injection      Relevant Medications   ibuprofen (ADVIL) 600 MG tablet   Other Relevant Orders   DG Elbow Complete Right   AMB referral to orthopedics    Meds ordered this encounter  Medications   ibuprofen (ADVIL) 600 MG tablet    Sig: Take 1 tablet (600 mg total) by mouth every 8 (eight) hours as needed.    Dispense:  30 tablet    Refill:  0    Order Specific Question:   Supervising Provider    Answer:   BEDSOLE, AMY E [[id number]]    Follow-up: Return if symptoms worsen or fail to improve with pcp.    12/02/2020, FNP

## 2022-03-12 NOTE — Patient Instructions (Addendum)
Complete xray(s) prior to leaving today. I will notify you of your results once received. A referral was placed today for orthopedist Please let us know if you have not heard back within 2 weeks about the referral.   Due to recent changes in healthcare laws, you may see results of your imaging and/or laboratory studies on MyChart before I have had a chance to review them.  I understand that in some cases there may be results that are confusing or concerning to you. Please understand that not all results are received at the same time and often I may need to interpret multiple results in order to provide you with the best plan of care or course of treatment. Therefore, I ask that you please give me 2 business days to thoroughly review all your results before contacting my office for clarification. Should we see a critical lab result, you will be contacted sooner.   It was a pleasure seeing you today! Please do not hesitate to reach out with any questions and or concerns.  Regards,   Mort Sawyers FNP-C

## 2022-03-27 ENCOUNTER — Ambulatory Visit: Payer: 59 | Admitting: Orthopedic Surgery

## 2022-03-27 ENCOUNTER — Encounter: Payer: Self-pay | Admitting: Orthopedic Surgery

## 2022-03-27 DIAGNOSIS — M19021 Primary osteoarthritis, right elbow: Secondary | ICD-10-CM | POA: Diagnosis not present

## 2022-03-27 DIAGNOSIS — G5621 Lesion of ulnar nerve, right upper limb: Secondary | ICD-10-CM | POA: Diagnosis not present

## 2022-03-27 MED ORDER — MELOXICAM 7.5 MG PO TABS: 7.5000 mg | ORAL_TABLET | Freq: Every day | ORAL | 0 refills | Status: AC

## 2022-03-27 NOTE — Progress Notes (Signed)
Office Visit Note   Patient: Jose Osborne           Date of Birth: 1971-07-29           MRN: [id number] Visit Date: 03/27/2022              Requested by: Mort Sawyers, FNP [address] [place],  [place] [address] PCP: Joaquim Nam, MD   Assessment & Plan: Visit Diagnoses:  1. Arthritis of right elbow   2. Ulnar neuritis, right     Plan: Discussed with patient that he has osteoarthritis of the right elbow at the Radiocapitellar and ulnohumeral articulations.  We discussed the nature of elbow arthritis as well as his diagnosis, prognosis, and both conservative and surgical treatment options.  He has not had any treatment for this so far so we will start with an oral anti-inflammatory medication.  I will send in a prescription for meloxicam.  He also has what sounds like ulnar neuritis on the side.  He has some sensitivity of the ulnar nerve that is only present when he rests his elbow on an object into the center console of his truck.  We discussed trying a removable gel pad to provide some cushion over the ulnar nerve.  He has no numbness and paresthesias at rest or with activity that would be concerning for cubital tunnel syndrome.  I can see him back again as needed.  Follow-Up Instructions: No follow-ups on file.   Orders:  No orders of the defined types were placed in this encounter.  No orders of the defined types were placed in this encounter.     Procedures: No procedures performed   Clinical Data: No additional findings.   Subjective: Chief Complaint  Patient presents with   Right Elbow - Pain    RIGHT Handed, NKI,     This is a 51 year old right-hand-dominant male who presents with right elbow pain.  Is been going on for 2 or 3 months now.  He denies any injury to the elbow.  He describes some limited range of motion of this elbow compared to the contralateral side that seems to be most noticeable with attempted extension.  He has pain at sort of  the posterior and lateral aspect of the elbow with intermittent swelling.  He has no pain over the lateral epicondyle.  He does not have much pain with his normal work activities.  He also describes a "zinging" sensation at the medial aspect of the elbow when he rests his elbow on a solid object.  The example he gives is when he rests his medial elbow on the center console of his truck he will feel a strange sensation just at the elbow.  He denies any numbness or paresthesias in his ring and small finger.  He has not had any treatment for either of these issues yet.    Review of Systems   Objective: Vital Signs: BP (!) 169/105 (BP Location: Left Arm, Patient Position: Sitting)   Pulse 65   Ht 5\' 9"  (1.753 m)   Wt 198 lb 8 oz (90 kg)   BMI 29.31 kg/m   Physical Exam Constitutional:      Appearance: Normal appearance.  Cardiovascular:     Rate and Rhythm: Normal rate.     Pulses: Normal pulses.  Pulmonary:     Effort: Pulmonary effort is normal.  Skin:    General: Skin is warm and dry.     Capillary  Refill: Capillary refill takes less than 2 seconds.  Neurological:     Mental Status: He is alert.     Right Elbow Exam   Tenderness  The patient is experiencing no tenderness.   Other  Erythema: absent Sensation: normal Pulse: present  Comments:  Full flexion of elbow but lacks approximately 10 degrees from full extension.  Full pronation and supination.  No TTP over lateral epicondyle or along common extensors.  No pain w/ resisted middle finger extension.  5/5 and painless triceps extension. No ulnar nerve instability.  Mildly positive Tinel at cubital tunnel.       Specialty Comments:  No specialty comments available.  Imaging: No results found.   PMFS History: Patient Active Problem List   Diagnosis Date Noted   Arthritis of right elbow 03/27/2022   Ulnar neuritis, right 03/27/2022   Right elbow pain 03/12/2022   Muscle spasm 01/17/2019   Advance care  planning 08/29/2018   Lip swelling 04/27/2017   HLD (hyperlipidemia) 05/13/2013   NECK PAIN 05/31/2010   Hypothyroidism 08/21/2008   ALLERGIC RHINITIS 08/04/2007   Past Medical History:  Diagnosis Date   Allergy    GERD (gastroesophageal reflux disease)    Hyperlipidemia    Hypothyroidism     Family History  Adopted: Yes  Problem Relation Age of Onset   Alcohol abuse Mother    Thyroid disease Sister    Hypertension Brother    Heart disease Neg Hx    Colon cancer Neg Hx    Prostate cancer Neg Hx     Past Surgical History:  Procedure Laterality Date   HAND SURGERY     Social History   Occupational History   Occupation: Musician: [employer]  Tobacco Use   Smoking status: Never   Smokeless tobacco: Former    Types: Snuff   Tobacco comments:    quit 09/16/15  Substance and Sexual Activity   Alcohol use: Yes    Alcohol/week: 0.0 standard drinks of alcohol    Comment: beer, occ   Drug use: No   Sexual activity: Not on file

## 2022-06-08 ENCOUNTER — Other Ambulatory Visit: Payer: Self-pay | Admitting: Family Medicine

## 2022-06-08 DIAGNOSIS — E039 Hypothyroidism, unspecified: Secondary | ICD-10-CM

## 2022-06-08 DIAGNOSIS — E785 Hyperlipidemia, unspecified: Secondary | ICD-10-CM

## 2022-06-10 NOTE — Telephone Encounter (Signed)
Refill request for levothyroxine (SYNTHROID) 175 MCG tablet  LOV - 03/12/22 w/tabitha Next OV - not scheduled yet; doing labs 06/13/22 Last refill - 12/11/21 #90/1

## 2022-06-10 NOTE — Telephone Encounter (Signed)
Patient is overdue for CPE with Dr. Damita Dunnings. Please call to schedule.

## 2022-06-10 NOTE — Telephone Encounter (Signed)
Called pt, pt stated he would do labs this Friday, 06/13/22 but would have to contact us back about scheduling his cpe. Pt then asked could he call our office back altogether to schedule both appts.

## 2022-06-11 NOTE — Telephone Encounter (Signed)
Patient has been advised rx was sent in. Advised patient he still needs to make his CPE appt not just labs. He said okay but did not schedule anything.

## 2022-06-11 NOTE — Telephone Encounter (Signed)
Pt called back wanting to know an update on his med refill. I told pt there wasn't a response by Dr. Damita Dunnings as of yet. Pt stated if only had 1 tablet left & since he has labs Friday, if the meds aren't refilled, he doesn't see a reason to come to his lab appt

## 2022-06-11 NOTE — Telephone Encounter (Signed)
Pt called back today stating he is out of levothyroxine (SYNTHROID) 175 MCG tablet. Pt is asking could meds be refilled since he's coming Friday, 06/13/22 for labs? Call back # [phone].

## 2022-06-13 ENCOUNTER — Other Ambulatory Visit: Payer: 59

## 2022-09-12 ENCOUNTER — Other Ambulatory Visit (INDEPENDENT_AMBULATORY_CARE_PROVIDER_SITE_OTHER): Payer: 59

## 2022-09-12 DIAGNOSIS — E039 Hypothyroidism, unspecified: Secondary | ICD-10-CM | POA: Diagnosis not present

## 2022-09-12 DIAGNOSIS — E785 Hyperlipidemia, unspecified: Secondary | ICD-10-CM

## 2022-09-12 LAB — COMPREHENSIVE METABOLIC PANEL
ALT: 77 U/L — ABNORMAL HIGH (ref 0–53)
AST: 39 U/L — ABNORMAL HIGH (ref 0–37)
Albumin: 4.4 g/dL (ref 3.5–5.2)
Alkaline Phosphatase: 55 U/L (ref 39–117)
BUN: 21 mg/dL (ref 6–23)
CO2: 26 mEq/L (ref 19–32)
Calcium: 8.9 mg/dL (ref 8.4–10.5)
Chloride: 104 mEq/L (ref 96–112)
Creatinine, Ser: 1.27 mg/dL (ref 0.40–1.50)
GFR: 64.32 mL/min (ref >60.00)
Glucose, Bld: 92 mg/dL (ref 70–99)
Potassium: 4.3 mEq/L (ref 3.5–5.1)
Sodium: 138 mEq/L (ref 135–145)
Total Bilirubin: 0.9 mg/dL (ref 0.2–1.2)
Total Protein: 6.8 g/dL (ref 6.0–8.3)

## 2022-09-12 LAB — LIPID PANEL
Cholesterol: 271 mg/dL — ABNORMAL HIGH (ref 0–200)
HDL: 64 mg/dL
LDL Cholesterol: 191 mg/dL — ABNORMAL HIGH (ref 0–99)
NonHDL: 207.49
Total CHOL/HDL Ratio: 4
Triglycerides: 83 mg/dL (ref 0.0–149.0)
VLDL: 16.6 mg/dL (ref 0.0–40.0)

## 2022-09-12 LAB — TSH: TSH: 0.07 u[IU]/mL — ABNORMAL LOW (ref 0.35–5.50)

## 2022-09-19 ENCOUNTER — Ambulatory Visit (INDEPENDENT_AMBULATORY_CARE_PROVIDER_SITE_OTHER): Payer: 59 | Admitting: Family Medicine

## 2022-09-19 ENCOUNTER — Encounter: Payer: Self-pay | Admitting: Family Medicine

## 2022-09-19 ENCOUNTER — Ambulatory Visit (INDEPENDENT_AMBULATORY_CARE_PROVIDER_SITE_OTHER)
Admission: RE | Admit: 2022-09-19 | Discharge: 2022-09-19 | Disposition: A | Discharge status: Home / Self Care | Payer: 59 | Source: Ambulatory Visit | Attending: Family Medicine | Admitting: Family Medicine

## 2022-09-19 VITALS — BP 130/82 | HR 72 | Temp 98.0°F | Ht 69.0 in | Wt 200.0 lb

## 2022-09-19 DIAGNOSIS — Z0000 Encounter for general adult medical examination without abnormal findings: Secondary | ICD-10-CM

## 2022-09-19 DIAGNOSIS — M79605 Pain in left leg: Secondary | ICD-10-CM | POA: Diagnosis not present

## 2022-09-19 DIAGNOSIS — Z7189 Other specified counseling: Secondary | ICD-10-CM

## 2022-09-19 DIAGNOSIS — E039 Hypothyroidism, unspecified: Secondary | ICD-10-CM

## 2022-09-19 DIAGNOSIS — R7989 Other specified abnormal findings of blood chemistry: Secondary | ICD-10-CM

## 2022-09-19 DIAGNOSIS — E785 Hyperlipidemia, unspecified: Secondary | ICD-10-CM

## 2022-09-19 MED ORDER — LEVOTHYROXINE SODIUM 175 MCG PO TABS: ORAL_TABLET | ORAL | 3 refills | Status: DC

## 2022-09-19 MED ORDER — BACLOFEN 10 MG PO TABS: ORAL_TABLET | ORAL | 1 refills | Status: DC

## 2022-09-19 NOTE — Progress Notes (Unsigned)
CPE- See plan.  Routine anticipatory guidance given to patient.  See health maintenance.  The possibility exists that previously documented standard health maintenance information may have been brought forward from a previous encounter into this note.  If needed, that same information has been updated to reflect the current situation based on today's encounter.    Tetanus 2017.  Flu shot d/w pt.   covid vaccine 2021. PNA not due.  shingles d/w pt.   Diet and exercise discussed. Still getting 18-20K steps per day.   Living will d/w pt.  Wife designated if he is incapacitated.  cologuard neg 2021 Defer PSA at this point given prev prostatitis.  Goal to avoid false positive, d/w pt.  no urinary sx.   Wife was out of work with foot surgery.  Father died recently.  Condolences offered.     LFTs up d/w pt.  Etoh d/w pt.  No tylenol.  No SI/HI.  Increase EtOH after father passed away.  Discussed.  TSH abnormal.  Discussed with patient about replacement and hyper versus hypothyroid pathophysiology.  Compliant with replacement.  Discussed dose adjustment and recheck.  Lipids d/w pt. diet has been off since his father passed away. The 10-year ASCVD risk score (Arnett DK, et al., 2019) is: 5.5%   Values used to calculate the score:     Age: 52 years     Sex: Male     Is Non-Hispanic African American: No     Diabetic: No     Tobacco smoker: No     Systolic Blood Pressure: 549 mmHg     Is BP treated: No     HDL Cholesterol: 64 mg/dL     Total Cholesterol: 271 mg/dL  L leg pain. Pain at night.  From the proximal L lateral shin distal.  No pain walking but pain laying down.  [place] about 6 months ago.  No pain at the knee.    PMH and SH reviewed  Meds, vitals, and allergies reviewed.   ROS: Per HPI.  Unless specifically indicated otherwise in HPI, the patient denies:  General: fever. Eyes: acute vision changes ENT: sore throat Cardiovascular: chest pain Respiratory: SOB GI: vomiting GU:  dysuria Musculoskeletal: acute back pain Derm: acute rash Neuro: acute motor dysfunction Psych: worsening mood Endocrine: polydipsia Heme: bleeding Allergy: hayfever  GEN: nad, alert and oriented HEENT: mucous membranes moist NECK: supple w/o LA CV: rrr. PULM: ctab, no inc wob ABD: soft, +bs EXT: no edema SKIN: no acute rash Left knee not tender to palpation of the joint line.  No skin changes on the shin.  Not tender palpation over any bony prominences.  Able to bear weight.  No bruising.  No erythema.

## 2022-09-19 NOTE — Patient Instructions (Addendum)
Recheck TSh and liver tests in about 2 months.   Go to the lab on the way out.   If you have mychart we'll likely use that to update you.    Cut back on thyroid medicine.  Take care.  Glad to see you.

## 2022-09-21 DIAGNOSIS — M79605 Pain in left leg: Secondary | ICD-10-CM | POA: Insufficient documentation

## 2022-09-21 NOTE — Assessment & Plan Note (Signed)
Tetanus 2017.  Flu shot d/w pt.   covid vaccine 2021. PNA not due.  shingles d/w pt.   Diet and exercise discussed. Still getting 18-20K steps per day.   Living will d/w pt.  Wife designated if he is incapacitated.  cologuard neg 2021 Defer PSA at this point given prev prostatitis.  Goal to avoid false positive, d/w pt.  no urinary sx.

## 2022-09-21 NOTE — Assessment & Plan Note (Signed)
Discussed working on diet.  Exercise is still good.

## 2022-09-21 NOTE — Assessment & Plan Note (Signed)
Living will d/w pt.  Wife designated if he is incapacitated.  

## 2022-09-21 NOTE — Assessment & Plan Note (Signed)
Discussed tapering alcohol but not quitting cold [place] and recheck LFTs in a few months.  He agrees.

## 2022-09-21 NOTE — Assessment & Plan Note (Signed)
Decrease levothyroxine to half a pill on Sundays and 1 tablet of the days.  175 mcg/tab.  Recheck TSH in about 2 months.

## 2022-09-21 NOTE — Assessment & Plan Note (Signed)
Not at the knee.  Check plain films.  See notes on imaging.

## 2023-04-27 ENCOUNTER — Encounter: Payer: Self-pay | Admitting: Family Medicine

## 2023-04-27 ENCOUNTER — Ambulatory Visit (INDEPENDENT_AMBULATORY_CARE_PROVIDER_SITE_OTHER): Payer: 59 | Admitting: Family Medicine

## 2023-04-27 ENCOUNTER — Ambulatory Visit
Admission: RE | Admit: 2023-04-27 | Discharge: 2023-04-27 | Disposition: A | Discharge status: Home / Self Care | Payer: 59 | Source: Ambulatory Visit | Attending: Family Medicine | Admitting: Family Medicine

## 2023-04-27 VITALS — BP 136/90 | HR 68 | Temp 98.2°F | Ht 69.0 in | Wt 198.0 lb

## 2023-04-27 DIAGNOSIS — S99922A Unspecified injury of left foot, initial encounter: Secondary | ICD-10-CM

## 2023-04-27 DIAGNOSIS — Z23 Encounter for immunization: Secondary | ICD-10-CM

## 2023-04-27 NOTE — Patient Instructions (Addendum)
The podiatry office will call you today   Elevate foot -use cold compress if you can  Stay off of it the best you can  Clean with soap and water    Tetanus shot today   We will let you know when radiology reads the xray   Antibiotic ointment is ok
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
## 2023-04-28 ENCOUNTER — Ambulatory Visit: Payer: 59 | Admitting: Podiatry

## 2023-04-28 DIAGNOSIS — S90212A Contusion of left great toe with damage to nail, initial encounter: Secondary | ICD-10-CM
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
## 2023-06-05 ENCOUNTER — Other Ambulatory Visit: Payer: Self-pay | Admitting: Family Medicine

## 2023-06-05 DIAGNOSIS — E039 Hypothyroidism, unspecified: Secondary | ICD-10-CM

## 2023-09-15 ENCOUNTER — Telehealth: Payer: Self-pay | Admitting: Family Medicine

## 2023-09-15 DIAGNOSIS — E785 Hyperlipidemia, unspecified: Secondary | ICD-10-CM

## 2023-09-15 DIAGNOSIS — E039 Hypothyroidism, unspecified: Secondary | ICD-10-CM

## 2023-09-15 NOTE — Telephone Encounter (Signed)
 Please schedule yearly visit when possible.  Thanks.

## 2023-09-17 NOTE — Telephone Encounter (Signed)
 Spoke with patient and he stated that he is pretty busy the next few months. He will callback to schedule later.

## 2023-09-18 NOTE — Telephone Encounter (Signed)
 Noted.

## 2024-05-29 ENCOUNTER — Other Ambulatory Visit: Payer: Self-pay | Admitting: Family Medicine

## 2024-05-29 DIAGNOSIS — E039 Hypothyroidism, unspecified: Secondary | ICD-10-CM

## 2024-08-26 ENCOUNTER — Other Ambulatory Visit (INDEPENDENT_AMBULATORY_CARE_PROVIDER_SITE_OTHER)

## 2024-08-26 DIAGNOSIS — E039 Hypothyroidism, unspecified: Secondary | ICD-10-CM | POA: Diagnosis not present

## 2024-08-26 DIAGNOSIS — E785 Hyperlipidemia, unspecified: Secondary | ICD-10-CM | POA: Diagnosis not present

## 2024-08-26 LAB — COMPREHENSIVE METABOLIC PANEL WITH GFR
ALT: 20 U/L (ref 0–53)
AST: 17 U/L (ref 0–37)
Albumin: 4.5 g/dL (ref 3.5–5.2)
Alkaline Phosphatase: 57 U/L (ref 39–117)
BUN: 25 mg/dL — ABNORMAL HIGH (ref 6–23)
CO2: 26 meq/L (ref 19–32)
Calcium: 9 mg/dL (ref 8.4–10.5)
Chloride: 105 meq/L (ref 96–112)
Creatinine, Ser: 1.17 mg/dL (ref 0.40–1.50)
GFR: 70 mL/min (ref >60.00)
Glucose, Bld: 87 mg/dL (ref 70–99)
Potassium: 4.4 meq/L (ref 3.5–5.1)
Sodium: 139 meq/L (ref 135–145)
Total Bilirubin: 0.7 mg/dL (ref 0.2–1.2)
Total Protein: 6.9 g/dL (ref 6.0–8.3)

## 2024-08-26 LAB — LIPID PANEL
Cholesterol: 203 mg/dL — ABNORMAL HIGH (ref 0–200)
HDL: 65.2 mg/dL (ref >39.00)
LDL Cholesterol: 131 mg/dL — ABNORMAL HIGH (ref 0–99)
NonHDL: 137.36
Total CHOL/HDL Ratio: 3
Triglycerides: 33 mg/dL (ref 0.0–149.0)
VLDL: 6.6 mg/dL (ref 0.0–40.0)

## 2024-08-26 LAB — TSH: TSH: 2.72 u[IU]/mL (ref 0.35–5.50)

## 2024-08-28 ENCOUNTER — Ambulatory Visit: Payer: Self-pay | Admitting: Family Medicine

## 2024-09-02 ENCOUNTER — Ambulatory Visit (INDEPENDENT_AMBULATORY_CARE_PROVIDER_SITE_OTHER): Admitting: Family Medicine

## 2024-09-02 ENCOUNTER — Encounter: Payer: Self-pay | Admitting: Family Medicine

## 2024-09-02 VITALS — BP 138/84 | HR 64 | Temp 98.3°F | Ht 67.25 in | Wt 189.2 lb

## 2024-09-02 DIAGNOSIS — M549 Dorsalgia, unspecified: Secondary | ICD-10-CM

## 2024-09-02 DIAGNOSIS — G4700 Insomnia, unspecified: Secondary | ICD-10-CM

## 2024-09-02 DIAGNOSIS — Z0000 Encounter for general adult medical examination without abnormal findings: Secondary | ICD-10-CM

## 2024-09-02 DIAGNOSIS — Z7189 Other specified counseling: Secondary | ICD-10-CM

## 2024-09-02 DIAGNOSIS — Z1211 Encounter for screening for malignant neoplasm of colon: Secondary | ICD-10-CM

## 2024-09-02 DIAGNOSIS — E039 Hypothyroidism, unspecified: Secondary | ICD-10-CM

## 2024-09-02 MED ORDER — TRAZODONE HCL 50 MG PO TABS: 25.0000 mg | ORAL_TABLET | Freq: Every evening | ORAL | 3 refills | Status: AC | PRN

## 2024-09-02 MED ORDER — LEVOTHYROXINE SODIUM 175 MCG PO TABS: ORAL_TABLET | ORAL | 3 refills | Status: AC

## 2024-09-02 MED ORDER — BACLOFEN 10 MG PO TABS: ORAL_TABLET | ORAL | 3 refills | Status: AC

## 2024-09-02 NOTE — Patient Instructions (Addendum)
 I would get a flu shot each fall.   Try trazodone at night and see if that helps.  Let me know if that isn't working.  Take care.  Glad to see you.

## 2024-09-02 NOTE — Progress Notes (Unsigned)
 CPE- See plan.  Routine anticipatory guidance given to patient.  See health maintenance.  The possibility exists that previously documented standard health maintenance information may have been brought forward from a previous encounter into this note.  If needed, that same information has been updated to reflect the current situation based on today's encounter.    Tetanus 2017.  Flu shot d/w pt.   covid vaccine 2021. PNA not due.  shingles d/w pt.   Diet and exercise discussed.  Living will d/w pt.  Wife designated if he is incapacitated.  cologuard ordered 2025.   Defer PSA at this point given prev prostatitis.  Goal to avoid false positive, d/w pt.  no urinary sx.   Using baclofen  for pain. He had fallen off a ladder earlier this year.  Prolonged driving doesn't help  He had h/o relief with baclofen  use.     Hypothyroidism.  TSH wnl.  He has been taking 175mcg tab, 1 a day except for 1/2 on Sundays.  Total of 6.5 tabs in a week.    Stressors d/w pt.  He is travelling [place] the [place] for jobs.  Job stressors d/w pt.  Limited etoh during the week.  Insomnia d/w pt.  No SI/HI.    PMH and SH reviewed  Meds, vitals, and allergies reviewed.   ROS: Per HPI.  Unless specifically indicated otherwise in HPI, the patient denies:  General: fever. Eyes: acute vision changes ENT: sore throat Cardiovascular: chest pain Respiratory: SOB GI: vomiting GU: dysuria Musculoskeletal: acute back pain Derm: acute rash Neuro: acute motor dysfunction Psych: worsening mood Endocrine: polydipsia Heme: bleeding Allergy: hayfever  GEN: nad, alert and oriented HEENT: mucous membranes moist NECK: supple w/o LA CV: rrr. PULM: ctab, no inc wob ABD: soft, +bs EXT: no edema SKIN: no acute rash

## 2024-09-06 DIAGNOSIS — G4700 Insomnia, unspecified: Secondary | ICD-10-CM | POA: Insufficient documentation

## 2024-09-06 DIAGNOSIS — M549 Dorsalgia, unspecified: Secondary | ICD-10-CM | POA: Insufficient documentation

## 2024-09-06 NOTE — Assessment & Plan Note (Signed)
Living will d/w pt.  Wife designated if he is incapacitated.  

## 2024-09-06 NOTE — Assessment & Plan Note (Signed)
 He can try trazodone  and update me as needed.  Routine cautions given to patient.

## 2024-09-06 NOTE — Assessment & Plan Note (Signed)
"   TSH wnl.  He has been taking 175mcg tab, 1 a day except for 1/2 on Sundays.  Total of 6.5 tabs in a week.   Continue as is. "

## 2024-09-06 NOTE — Assessment & Plan Note (Signed)
 Can use baclofen  if needed.  Sedation caution.

## 2024-09-06 NOTE — Assessment & Plan Note (Signed)
 Tetanus 2017.  Flu shot d/w pt.   covid vaccine 2021. PNA d/w pt.  shingles d/w pt.   Diet and exercise discussed.  Living will d/w pt.  Wife designated if he is incapacitated.  cologuard ordered 2025.   Defer PSA at this point given prev prostatitis.  Goal to avoid false positive, d/w pt.  no urinary sx.
